# Patient Record
Sex: Female | Born: 1960 | Race: White | Hispanic: No | Marital: Married | Smoking: Current every day smoker
Health system: Southern US, Community
[De-identification: ages and names within clinical notes are randomized; demographics above are authoritative.]

## PROBLEM LIST (undated history)

## (undated) DIAGNOSIS — C50919 Malignant neoplasm of unspecified site of unspecified female breast: Principal | ICD-10-CM

## (undated) DIAGNOSIS — IMO0001 Reserved for inherently not codable concepts without codable children: Secondary | ICD-10-CM

## (undated) DIAGNOSIS — F32A Depression, unspecified: Secondary | ICD-10-CM

## (undated) DIAGNOSIS — Z9989 Dependence on other enabling machines and devices: Secondary | ICD-10-CM

## (undated) DIAGNOSIS — I10 Essential (primary) hypertension: Secondary | ICD-10-CM

## (undated) DIAGNOSIS — F329 Major depressive disorder, single episode, unspecified: Secondary | ICD-10-CM

## (undated) DIAGNOSIS — D649 Anemia, unspecified: Secondary | ICD-10-CM

## (undated) DIAGNOSIS — K635 Polyp of colon: Secondary | ICD-10-CM

## (undated) DIAGNOSIS — K219 Gastro-esophageal reflux disease without esophagitis: Secondary | ICD-10-CM

## (undated) DIAGNOSIS — G43909 Migraine, unspecified, not intractable, without status migrainosus: Secondary | ICD-10-CM

## (undated) DIAGNOSIS — E785 Hyperlipidemia, unspecified: Secondary | ICD-10-CM

## (undated) DIAGNOSIS — E119 Type 2 diabetes mellitus without complications: Secondary | ICD-10-CM

## (undated) DIAGNOSIS — G4733 Obstructive sleep apnea (adult) (pediatric): Secondary | ICD-10-CM

## (undated) HISTORY — DX: Essential (primary) hypertension: I10

## (undated) HISTORY — DX: Gastro-esophageal reflux disease without esophagitis: K21.9

## (undated) HISTORY — DX: Hyperlipidemia, unspecified: E78.5

## (undated) HISTORY — DX: Reserved for inherently not codable concepts without codable children: IMO0001

## (undated) HISTORY — DX: Depression, unspecified: F32.A

## (undated) HISTORY — DX: Malignant neoplasm of unspecified site of unspecified female breast: C50.919

## (undated) HISTORY — DX: Major depressive disorder, single episode, unspecified: F32.9

## (undated) HISTORY — DX: Polyp of colon: K63.5

## (undated) HISTORY — DX: Migraine, unspecified, not intractable, without status migrainosus: G43.909

## (undated) HISTORY — DX: Type 2 diabetes mellitus without complications: E11.9

## (undated) HISTORY — DX: Dependence on other enabling machines and devices: Z99.89

## (undated) HISTORY — DX: Anemia, unspecified: D64.9

## (undated) HISTORY — DX: Obstructive sleep apnea (adult) (pediatric): G47.33

## (undated) HISTORY — PX: CHOLECYSTECTOMY: SHX55

---

## 2004-12-03 ENCOUNTER — Ambulatory Visit: Payer: Self-pay | Admitting: Internal Medicine

## 2005-12-20 ENCOUNTER — Ambulatory Visit: Payer: Self-pay | Admitting: Internal Medicine

## 2006-12-25 ENCOUNTER — Ambulatory Visit: Payer: Self-pay | Admitting: Internal Medicine

## 2006-12-25 LAB — CONVERTED CEMR LAB
ALT: 13 units/L (ref 0–40)
Alkaline Phosphatase: 45 units/L (ref 39–117)
BUN: 9 mg/dL (ref 6–23)
Basophils Absolute: 0.1 10*3/uL (ref 0.0–0.1)
Basophils Relative: 1.1 % — ABNORMAL HIGH (ref 0.0–1.0)
CO2: 26 meq/L (ref 19–32)
Calcium: 8.4 mg/dL (ref 8.4–10.5)
Chloride: 110 meq/L (ref 96–112)
Eosinophils Relative: 2.7 % (ref 0.0–5.0)
GFR calc Af Amer: 101 mL/min
GFR calc non Af Amer: 83 mL/min
HCT: 31.2 % — ABNORMAL LOW (ref 36.0–46.0)
MCHC: 32.7 g/dL (ref 30.0–36.0)
Neutrophils Relative %: 54.1 % (ref 43.0–77.0)
RBC: 4.53 M/uL (ref 3.87–5.11)
RDW: 16.2 % — ABNORMAL HIGH (ref 11.5–14.6)
Total CHOL/HDL Ratio: 6.6
Total Protein: 6.2 g/dL (ref 6.0–8.3)
Triglycerides: 206 mg/dL (ref 0–149)
VLDL: 41 mg/dL — ABNORMAL HIGH (ref 0–40)
WBC: 7.7 10*3/uL (ref 4.5–10.5)

## 2007-01-07 ENCOUNTER — Ambulatory Visit: Payer: Self-pay | Admitting: Internal Medicine

## 2007-01-19 ENCOUNTER — Encounter: Admission: RE | Admit: 2007-01-19 | Discharge: 2007-01-19 | Payer: Self-pay | Admitting: Internal Medicine

## 2007-02-04 ENCOUNTER — Encounter: Payer: Self-pay | Admitting: Internal Medicine

## 2007-03-13 ENCOUNTER — Encounter: Payer: Self-pay | Admitting: Internal Medicine

## 2007-03-17 ENCOUNTER — Other Ambulatory Visit: Admission: RE | Admit: 2007-03-17 | Discharge: 2007-03-17 | Payer: Self-pay | Admitting: Internal Medicine

## 2007-03-17 ENCOUNTER — Encounter (INDEPENDENT_AMBULATORY_CARE_PROVIDER_SITE_OTHER): Payer: Self-pay | Admitting: *Deleted

## 2007-03-17 ENCOUNTER — Ambulatory Visit: Payer: Self-pay | Admitting: Internal Medicine

## 2007-03-30 ENCOUNTER — Encounter: Payer: Self-pay | Admitting: Internal Medicine

## 2007-04-07 ENCOUNTER — Encounter: Payer: Self-pay | Admitting: Internal Medicine

## 2007-04-09 DIAGNOSIS — G43909 Migraine, unspecified, not intractable, without status migrainosus: Secondary | ICD-10-CM | POA: Insufficient documentation

## 2007-04-09 DIAGNOSIS — I10 Essential (primary) hypertension: Secondary | ICD-10-CM | POA: Insufficient documentation

## 2007-04-09 DIAGNOSIS — Z8711 Personal history of peptic ulcer disease: Secondary | ICD-10-CM

## 2007-06-01 ENCOUNTER — Ambulatory Visit: Payer: Self-pay | Admitting: Internal Medicine

## 2007-06-01 DIAGNOSIS — E785 Hyperlipidemia, unspecified: Secondary | ICD-10-CM | POA: Insufficient documentation

## 2007-06-01 DIAGNOSIS — D649 Anemia, unspecified: Secondary | ICD-10-CM

## 2007-06-01 DIAGNOSIS — D126 Benign neoplasm of colon, unspecified: Secondary | ICD-10-CM

## 2007-06-02 LAB — CONVERTED CEMR LAB
HDL: 33.7 mg/dL — ABNORMAL LOW (ref >39.0)
Hemoglobin: 13.5 g/dL (ref 12.0–15.0)
Triglycerides: 277 mg/dL (ref 0–149)
VLDL: 55 mg/dL — ABNORMAL HIGH (ref 0–40)

## 2007-06-03 ENCOUNTER — Encounter (INDEPENDENT_AMBULATORY_CARE_PROVIDER_SITE_OTHER): Payer: Self-pay | Admitting: *Deleted

## 2007-06-05 ENCOUNTER — Telehealth (INDEPENDENT_AMBULATORY_CARE_PROVIDER_SITE_OTHER): Payer: Self-pay | Admitting: *Deleted

## 2007-07-21 ENCOUNTER — Encounter: Payer: Self-pay | Admitting: Internal Medicine

## 2007-12-02 ENCOUNTER — Ambulatory Visit: Payer: Self-pay | Admitting: Internal Medicine

## 2007-12-02 DIAGNOSIS — F329 Major depressive disorder, single episode, unspecified: Secondary | ICD-10-CM

## 2007-12-02 DIAGNOSIS — F17200 Nicotine dependence, unspecified, uncomplicated: Secondary | ICD-10-CM | POA: Insufficient documentation

## 2007-12-06 ENCOUNTER — Encounter: Payer: Self-pay | Admitting: Internal Medicine

## 2008-01-04 ENCOUNTER — Ambulatory Visit: Payer: Self-pay | Admitting: Internal Medicine

## 2008-01-04 DIAGNOSIS — E669 Obesity, unspecified: Secondary | ICD-10-CM | POA: Insufficient documentation

## 2008-01-12 ENCOUNTER — Ambulatory Visit: Payer: Self-pay | Admitting: Internal Medicine

## 2008-10-04 ENCOUNTER — Ambulatory Visit: Payer: Self-pay | Admitting: Internal Medicine

## 2009-01-16 HISTORY — PX: NOVASURE ABLATION: SHX5394

## 2009-02-24 ENCOUNTER — Ambulatory Visit: Payer: Self-pay | Admitting: Internal Medicine

## 2009-11-06 ENCOUNTER — Ambulatory Visit: Payer: Self-pay | Admitting: Internal Medicine

## 2009-11-09 LAB — CONVERTED CEMR LAB
Basophils Absolute: 0.2 10*3/uL — ABNORMAL HIGH (ref 0.0–0.1)
CO2: 26 meq/L (ref 19–32)
Direct LDL: 183.4 mg/dL
Eosinophils Absolute: 3.7 10*3/uL — ABNORMAL HIGH (ref 0.0–0.7)
Glucose, Bld: 82 mg/dL (ref 70–99)
HCT: 45.6 % (ref 36.0–46.0)
Hemoglobin: 14.8 g/dL (ref 12.0–15.0)
Iron: 149 ug/dL — ABNORMAL HIGH (ref 42–145)
Lymphs Abs: 2.9 10*3/uL (ref 0.7–4.0)
MCHC: 32.4 g/dL (ref 30.0–36.0)
MCV: 86.2 fL (ref 78.0–100.0)
Neutro Abs: 1.9 10*3/uL (ref 1.4–7.7)
Potassium: 4.5 meq/L (ref 3.5–5.1)
RDW: 14.2 % (ref 11.5–14.6)
Sodium: 144 meq/L (ref 135–145)
Transferrin: 359.1 mg/dL (ref 212.0–360.0)
VLDL: 50.2 mg/dL — ABNORMAL HIGH (ref 0.0–40.0)

## 2010-01-12 ENCOUNTER — Ambulatory Visit: Payer: Self-pay | Admitting: Internal Medicine

## 2010-01-22 ENCOUNTER — Telehealth: Payer: Self-pay | Admitting: Internal Medicine

## 2010-02-07 ENCOUNTER — Telehealth (INDEPENDENT_AMBULATORY_CARE_PROVIDER_SITE_OTHER): Payer: Self-pay | Admitting: *Deleted

## 2010-08-09 ENCOUNTER — Telehealth (INDEPENDENT_AMBULATORY_CARE_PROVIDER_SITE_OTHER): Payer: Self-pay | Admitting: *Deleted

## 2010-08-22 ENCOUNTER — Ambulatory Visit: Payer: Self-pay | Admitting: Internal Medicine

## 2010-08-24 ENCOUNTER — Ambulatory Visit: Payer: Self-pay | Admitting: Internal Medicine

## 2010-08-28 LAB — CONVERTED CEMR LAB
AST: 22 units/L (ref 0–37)
Cholesterol: 175 mg/dL (ref 0–200)
LDL Cholesterol: 101 mg/dL — ABNORMAL HIGH (ref 0–99)

## 2010-10-10 ENCOUNTER — Encounter: Payer: Self-pay | Admitting: Internal Medicine

## 2010-10-19 ENCOUNTER — Ambulatory Visit: Payer: Self-pay | Admitting: Internal Medicine

## 2010-10-19 ENCOUNTER — Encounter: Payer: Self-pay | Admitting: Internal Medicine

## 2010-12-09 ENCOUNTER — Encounter: Payer: Self-pay | Admitting: Internal Medicine

## 2010-12-18 NOTE — Assessment & Plan Note (Signed)
Summary: FOLLOWUP APPT--MED REFILL///SPH   Vital Signs:  Patient profile:   50 year old female Height:      67 inches Weight:      234.50 pounds BMI:     36.86 Pulse rate:   90 / minute Pulse rhythm:   regular BP sitting:   126 / 84  (left arm) Cuff size:   large  Vitals Entered By: Army Fossa CMA (August 22, 2010 3:06 PM) CC: Follow up on meds, not fasting  Comments declines flu shot    History of Present Illness: ROV started choleterol meds   ROS c/o hot flashes, severe  no myalgias  except in the L arm  when she takes Ca symptoms started before she initiated statins) diet-- no better  exercise-- non existen on further questioning, she has been depressed, states that she has a very poor relationship with her husband mostly because "his nasty attitude about my smoking, he is not supportive, I don't feel loved"  in the past she had some suicidal ideas but no plans  Allergies: No Known Drug Allergies  Past History:  Past Medical History: Hypertension dyslipidemia colon polyps anemia-- EGD and C  scope 2008; Novasure 2010 for excessive vag bleed  migraines HTN Depression  Past Surgical History: Reviewed history from 11/06/2009 and no changes required. Cholecystectomy NovaSure 01/2009  Social History: Reviewed history from 11/06/2009 and no changes required. Married one daughter birth control, husband had a vasectomy Current Smoker (2 PPD) Alcohol use-no Drug use-no Regular exercise-no  Physical Exam  General:  alert and well-developed.   Psych:  Oriented X3, memory intact for recent and remote, normally interactive, and good eye contact. no anxious but  tearful    Impression & Recommendations:  Problem # 1:  DYSLIPIDEMIA (ICD-272.4) started simvastatin recently, good tolerance, labs Her updated medication list for this problem includes:    Simvastatin 40 Mg Tabs (Simvastatin) .Marland Kitchen... 1 by mouth at bedtime  Labs Reviewed: SGOT: 17  (11/06/2009)   SGPT: 12 (11/06/2009)   HDL:32.80 (11/06/2009), 33.7 (06/01/2007)  LDL:DEL (06/01/2007), DEL (12/25/2006)  Chol:241 (11/06/2009), 242 (06/01/2007)  Trig:251.0 (11/06/2009), 277 (06/01/2007)  Problem # 2:  DEPRESSION (ICD-311) we had an extensive discussion about how she feels. In the past she saw  a Librarian, academic which helped. I counseled  her today the best I could. She declined   a referral to a counselor or to start any medication.  States that she will think about it and let me know. Again no suicidal ideas.  Problem # 3:  other issues  also complained of hot  flashes and chronic, on-off  left breast pain (no chest pain) She will see her gynecologist next month, recommend to discuss these issues with them  Problem # 4:  face-to-face more than 25 minutes, more than 50% of the time spent counseling  Complete Medication List: 1)  Prilosec 20 Mg Cpdr (Omeprazole) .... Take 1 capsule by mouth once a day 2)  Atenolol 25 Mg Tabs (Atenolol) .Marland Kitchen.. 1 by mouth qd 3)  Simvastatin 40 Mg Tabs (Simvastatin) .Marland Kitchen.. 1 by mouth at bedtime  Patient Instructions: 1)  came back fasting within a week: 2)  FLP AST ALT---dx high chol. 3)  came back by 12-11 for your physical Prescriptions: SIMVASTATIN 40 MG TABS (SIMVASTATIN) 1 by mouth at bedtime  #90 x 1   Entered by:   Army Fossa CMA   Authorized by:   Nolon Rod. Cheryl Chay MD   Signed by:  Girtrude Enslin E. Nohelani Benning MD on 08/22/2010   Method used:   Faxed to ...       [facility] (mail-order)       [address]       [place], [place]  [address]       Ph: [phone]       Fax: [phone]   RxID:   [id number] ATENOLOL 25 MG TABS (ATENOLOL) 1 by mouth qd  #90 x 1   Entered by:   Army Fossa CMA   Authorized by:   Nolon Rod. Nevaeh Korte MD   Signed by:   Nolon Rod. Journey Ratterman MD on 08/22/2010   Method used:   Faxed to ...       [address] [facility] (mail-order)       [address]       [place], [place]  [address]       Ph: [phone]       Fax: [phone]   RxID:    [id number]

## 2010-12-18 NOTE — Progress Notes (Signed)
Summary: FYI ED  Phone Note Call from Patient   Caller: Patient Summary of Call: Pt c/o chest pain a heaviness  left breast area and under left arm. Recommend pt to [facility] for full assessment,  pt  agreed .Kandice Hams  January 22, 2010 12:27 PM  Initial call taken by: Kandice Hams,  January 22, 2010 12:27 PM  Follow-up for Phone Call        noted Follow-up by: [facility] E. Mikiala Fugett MD,  January 22, 2010 12:50 PM

## 2010-12-18 NOTE — Progress Notes (Signed)
Summary: refill  Phone Note Refill Request Message from:  Fax from Pharmacy on February 07, 2010 2:49 PM  Refills Requested: Medication #1:  ATENOLOL 25 MG TABS 1 by mouth qd  Medication #2:  SIMVASTATIN 40 MG TABS 1 by mouth at bedtime [facility] fax [phone]   Method Requested: Mail to Patient Next Appointment Scheduled: no appt Initial call taken by: Barb Merino,  February 07, 2010 2:50 PM    Prescriptions: SIMVASTATIN 40 MG TABS (SIMVASTATIN) 1 by mouth at bedtime  #90 x 1   Entered by:   Shary Decamp   Authorized by:   Nolon Rod. Paz MD   Signed by:   Shary Decamp on 02/07/2010   Method used:   Faxed to ...       [facility] (mail-order)       [address]       [place], [place]  [address]       Ph: [phone]       Fax: [phone]   RxID:   [id number] ATENOLOL 25 MG TABS (ATENOLOL) 1 by mouth qd  #90 x 1   Entered by:   Shary Decamp   Authorized by:   Nolon Rod. Paz MD   Signed by:   Shary Decamp on 02/07/2010   Method used:   Faxed to ...       [address] [facility] (mail-order)       [address]       [place], [place]  [address]       Ph: [phone]       Fax: [phone]   RxID:   [id number]

## 2010-12-18 NOTE — Progress Notes (Signed)
Summary: refill  Phone Note Refill Request Message from:  Fax from Pharmacy on August 09, 2010 8:57 AM  Refills Requested: Medication #1:  ATENOLOL 25 MG TABS 1 by mouth qd [facility] - fax [phone]  Initial call taken by: Okey Regal Spring,  August 09, 2010 9:06 AM    Prescriptions: ATENOLOL 25 MG TABS (ATENOLOL) 1 by mouth qd  #90 x 0   Entered by:   Army Fossa CMA   Authorized by:   Nolon Rod. Paz MD   Signed by:   Army Fossa CMA on 08/09/2010   Method used:   Faxed to ...       [address] [facility] (mail-order)       [address]       [place], [place]  [address]       Ph: [phone]       Fax: [phone]   RxID:   [id number]

## 2010-12-18 NOTE — Assessment & Plan Note (Signed)
Summary: neck pain---alr   Vital Signs:  Patient profile:   51 year old female Weight:      235 pounds Pulse rate:   76 / minute BP sitting:   124 / 80  (right arm)  Vitals Entered By: Doristine Devoid (January 12, 2010 3:25 PM) CC: 2 month roa- HA x4 days starts in neck and goes up back of head    History of Present Illness: 4 day history of pain the pain developed suddenly 4 days ago, located in the posterior neck, radiates  from there to the back of the head,  on and off, no radiation to UE not worse with neck movements, decreased with a neck massage she has a history of migraines,, current pain  is different from her migraines   Allergies: No Known Drug Allergies  Past History:  Past Medical History: Reviewed history from 11/06/2009 and no changes required. Hypertension dyslipidemia colon polyps anemia-- EGD and C  scope 2008; Novasure 2010 for excessive vag bleed  migraines HTN Depression  Past Surgical History: Reviewed history from 11/06/2009 and no changes required. Cholecystectomy NovaSure 01/2009  Social History: Reviewed history from 11/06/2009 and no changes required. Married one daughter birth control, husband had a vasectomy Current Smoker (2 PPD) Alcohol use-no Drug use-no Regular exercise-no  Review of Systems       denies nausea or vomiting not taken any medication for these no fever or sinus congestion she did have a crown put on few days ago by her dentist  Physical Exam  General:  alert, well-developed, and overweight-appearing. no apparent distress  Neck:  full range of motion Lungs:  normal respiratory effort, no intercostal retractions, no accessory muscle use, and normal breath sounds.   Heart:  normal rate, regular rhythm, no murmur, and no gallop.   Neurologic:  alert & oriented X3 and cranial nerves II-XII intact.  speech normal reflexes symmetric motor   intact   Impression & Recommendations:  Problem # 1:  NECK PAIN  (ICD-723.1)  4-day history of pain, located more in the neck than in the head Plan: Conservative treatment with Flexeril and Tylenol if she is not better in a few days, will proceed with scan   patient will call me if she is not improving or if she gets worse   Her updated medication list for this problem includes:    Flexeril 10 Mg Tabs (Cyclobenzaprine hcl) ..... One by mouth twice a day as needed for pain  Complete Medication List: 1)  Prilosec 20 Mg Cpdr (Omeprazole) .... Take 1 capsule by mouth once a day 2)  Atenolol 25 Mg Tabs (Atenolol) .Marland Kitchen.. 1 by mouth qd 3)  Simvastatin 40 Mg Tabs (Simvastatin) .Marland Kitchen.. 1 by mouth at bedtime 4)  Flexeril 10 Mg Tabs (Cyclobenzaprine hcl) .... One by mouth twice a day as needed for pain  Patient Instructions: 1)  warm compress to the neck 2)  Flexeril twice a day as needed, will cause drowsiness 3)  Take 1000 mg of tylenol every 6 hours as needed for relief of pain. Avoid taking more than 4000 mg in a 24 hour period( can cause liver damage in higher doses).  4)  call if not better in a few days 5)  call anytime if the pain gets worse Prescriptions: FLEXERIL 10 MG TABS (CYCLOBENZAPRINE HCL) one by mouth twice a day as needed for pain  #20 x 0   Entered and Authorized by:   Nolon Rod. Petrita Blunck MD   Signed  by:   Nolon Rod Najmah Carradine MD on 01/12/2010   Method used:   Print then Give to Patient   RxID:   [id number]

## 2010-12-18 NOTE — Letter (Signed)
Summary: [facility]  [facility]   Imported By: Lanelle Bal 10/25/2010 14:53:30  _____________________________________________________________________  External Attachment:    Type:   Image     Comment:   External Document

## 2010-12-18 NOTE — Progress Notes (Signed)
Summary: refill/appt--CALLED, MADE APPT  Phone Note Refill Request Message from:  Fax from Pharmacy on August 09, 2010 10:58 AM  Refills Requested: Medication #1:  SIMVASTATIN 40 MG TABS 1 by mouth at bedtime [facility] - fax [phone]  Initial call taken by: Okey Regal Spring,  August 09, 2010 11:00 AM  Follow-up for Phone Call        refilled meds once ,needs OV before addtional refills. Army Fossa CMA  August 09, 2010 11:12 AM   Additional Follow-up for Phone Call Additional follow up Details #1::        MADE FOLLOWUP APPT FOR 10/5 Additional Follow-up by: Jerolyn Shin,  August 13, 2010 10:30 AM    Prescriptions: SIMVASTATIN 40 MG TABS (SIMVASTATIN) 1 by mouth at bedtime  #90 x 0   Entered by:   Army Fossa CMA   Authorized by:   Nolon Rod. Paz MD   Signed by:   Army Fossa CMA on 08/09/2010   Method used:   Faxed to ...       [address] [facility] (mail-order)       [address]       [place], [place]  [address]       Ph: [phone]       Fax: [phone]   RxID:   [id number]

## 2010-12-18 NOTE — Assessment & Plan Note (Signed)
Summary: CPX///SPH   Vital Signs:  Patient profile:   50 year old female Height:      67 inches Weight:      240 pounds Pulse rate:   79 / minute Pulse rhythm:   regular BP sitting:   152 / 92  (left arm) Cuff size:   large  Vitals Entered By: Army Fossa CMA (October 19, 2010 2:58 PM) CC: CPX, not fasting Comments BP High @ gyn. declines flu shot not gotten results back from PAP [facility]   History of Present Illness: CPX, not fasting  BP noted to be  elevated at gyn office     Current Medications (verified): 1)  Prilosec 20 Mg Cpdr (Omeprazole) .... Take 1 Capsule By Mouth Once A Day 2)  Atenolol 25 Mg Tabs (Atenolol) .Marland Kitchen.. 1 By Mouth Qd 3)  Simvastatin 40 Mg Tabs (Simvastatin) .Marland Kitchen.. 1 By Mouth At Bedtime  Allergies (verified): No Known Drug Allergies  Past History:  Past Medical History: Reviewed history from 08/22/2010 and no changes required. Hypertension dyslipidemia colon polyps anemia-- EGD and C  scope 2008; Novasure 2010 for excessive vag bleed  migraines HTN Depression  Past Surgical History: Reviewed history from 11/06/2009 and no changes required. Cholecystectomy NovaSure 01/2009  Family History: CAD - F, several MIs, CABG HTN - F DM - F, sis stroke - GM colon Ca - PGF, age og dx? breast Ca -  G Aunt parkinson-- F  Social History: Married one daughter birth control, husband had a vasectomy Current Smoker (2 PPD) Alcohol use-no Drug use-no Regular exercise--no works at an [employer]   Review of Systems General:  Denies weight loss. CV:  Denies chest pain or discomfort and swelling of feet. Resp:  Denies cough and wheezing. GI:  Denies bloody stools, diarrhea, nausea, and vomiting.  Physical Exam  General:  alert, well-developed, and overweight-appearing.   Neck:  no masses.   Lungs:  normal respiratory effort, no intercostal retractions, no accessory muscle use, and normal breath sounds.   Heart:  normal  rate, regular rhythm, no murmur, and no gallop.   Abdomen:  soft, non-tender, no distention, no masses, no guarding, and no rigidity.   Extremities:  no pretibial edema bilaterally  Psych:  Oriented X3, memory intact for recent and remote, normally interactive, and good eye contact. no anxious , no depress    Impression & Recommendations:  Problem # 1:  HEALTH SCREENING (ICD-V70.0) Td 2008 declined flu shot, explained benefits  sees gyn, papa results pending  had a EGD - Cscope in the past d/t iron def ( 2008 )  Had one colon polyp, next Cscope per Dr Dan Humphreys in [place]  tobacco-- we did not discussed that today, see last OV  extensive conversation about her weight, very  aware she needs to loose wt d/t risk of obesity.  States lack of motivation is her main problem; I tried to motivate her but I don't think I was very succesful; does not like to see a counselor W W? she dissmised the idea   EKG at baseline compared to previous   Orders: Venipuncture (45409) TLB-BMP (Basic Metabolic Panel-BMET) (80048-METABOL) TLB-CBC Platelet - w/Differential (85025-CBCD) EKG w/ Interpretation (93000)  Problem # 2:  DYSLIPIDEMIA (ICD-272.4) better  Her updated medication list for this problem includes:    Simvastatin 40 Mg Tabs (Simvastatin) .Marland Kitchen... 1 by mouth at bedtime  Labs Reviewed: SGOT: 22 (08/24/2010)   SGPT: 17 (08/24/2010)   HDL:34.60 (08/24/2010), 32.80 (11/06/2009)  LDL:101 (08/24/2010), DEL (06/01/2007)  Chol:175 (08/24/2010), 241 (11/06/2009)  Trig:198.0 (08/24/2010), 251.0 (11/06/2009)  Problem # 3:  HYPERTENSION (ICD-401.9) not well controlled lately , increase atenolol. see instructions   Her updated medication list for this problem includes:    Atenolol 50 Mg Tabs (Atenolol) .Marland Kitchen... 1 by mouth once daily  BP today: 152/92 Prior BP: 126/84 (08/22/2010)  Labs Reviewed: K+: 4.5 (11/06/2009) Creat: : 0.8 (11/06/2009)   Chol: 175 (08/24/2010)   HDL: 34.60 (08/24/2010)   LDL: 101  (08/24/2010)   TG: 198.0 (08/24/2010)  Complete Medication List: 1)  Prilosec 20 Mg Cpdr (Omeprazole) .... Take 1 capsule by mouth once a day 2)  Atenolol 50 Mg Tabs (Atenolol) .Marland Kitchen.. 1 by mouth once daily 3)  Simvastatin 40 Mg Tabs (Simvastatin) .Marland Kitchen.. 1 by mouth at bedtime  Patient Instructions: 1)  increase atenolol  to 50mg  ----> Check your blood pressure 2 or 3 times a week. If it is more than 140/85 consistently,please let us know  2)  Please schedule a follow-up appointment in 6 months .  Prescriptions: ATENOLOL 50 MG TABS (ATENOLOL) 1 by mouth once daily  #90 x 1   Entered and Authorized by:   Elita Quick E. Paz MD   Signed by:   Nolon Rod. Paz MD on 10/19/2010   Method used:   Print then Give to Patient   RxID:   [id number]    Orders Added: 1)  Venipuncture [14782] 2)  TLB-BMP (Basic Metabolic Panel-BMET) [80048-METABOL] 3)  TLB-CBC Platelet - w/Differential [85025-CBCD] 4)  EKG w/ Interpretation [93000] 5)  Est. Patient age 48-64 [3]     Preventive Care Screening  Mammogram:    Date:  03/18/2010    Results:  normal-per pt     Risk Factors:  Mammogram History:     Date of Last Mammogram:  03/18/2010    Results:  normal-per pt

## 2010-12-28 ENCOUNTER — Ambulatory Visit (INDEPENDENT_AMBULATORY_CARE_PROVIDER_SITE_OTHER): Payer: Managed Care, Other (non HMO) | Admitting: Internal Medicine

## 2010-12-28 ENCOUNTER — Other Ambulatory Visit: Payer: Self-pay | Admitting: Internal Medicine

## 2010-12-28 ENCOUNTER — Encounter: Payer: Self-pay | Admitting: Internal Medicine

## 2010-12-28 DIAGNOSIS — R599 Enlarged lymph nodes, unspecified: Secondary | ICD-10-CM | POA: Insufficient documentation

## 2010-12-28 DIAGNOSIS — Z0000 Encounter for general adult medical examination without abnormal findings: Secondary | ICD-10-CM

## 2010-12-28 LAB — CBC WITH DIFFERENTIAL/PLATELET
Basophils Relative: 0.4 % (ref 0.0–3.0)
Eosinophils Relative: 3 % (ref 0.0–5.0)
Hemoglobin: 14.8 g/dL (ref 12.0–15.0)
Lymphocytes Relative: 30.4 % (ref 12.0–46.0)
MCHC: 33.9 g/dL (ref 30.0–36.0)
Monocytes Relative: 7.6 % (ref 3.0–12.0)
Neutro Abs: 5.7 10*3/uL (ref 1.4–7.7)
RBC: 5.04 Mil/uL (ref 3.87–5.11)
WBC: 9.8 10*3/uL (ref 4.5–10.5)

## 2010-12-28 LAB — BASIC METABOLIC PANEL
CO2: 27 mEq/L (ref 19–32)
Calcium: 9.2 mg/dL (ref 8.4–10.5)
GFR: 73.08 mL/min (ref >60.00)
Sodium: 141 mEq/L (ref 135–145)

## 2011-01-03 NOTE — Assessment & Plan Note (Signed)
Summary: neck swelling///sph   Vital Signs:  Patient profile:   50 year old female Weight:      240.25 pounds Temp:     98.4 degrees F oral Pulse rate:   82 / minute Pulse rhythm:   regular BP sitting:   134 / 84  (left arm) Cuff size:   large  Vitals Entered By: Army Fossa CMA (December 28, 2010 8:33 AM) CC: Pt here c/o -Ha's - pain from (L) ear down neck Comments started Sunday [facility] [address]   History of Present Illness: the patient developed a flulike illness one week ago, she felt achy and chilly she is here today because she has  pain at the left neck, from the ear down. The area is a slightly puffy  ROS No sore throat or runny nose No fevers No clear discharge cough is at baseline  Current Medications (verified): 1)  Prilosec 20 Mg Cpdr (Omeprazole) .... Take 1 Capsule By Mouth Once A Day 2)  Atenolol 50 Mg Tabs (Atenolol) .Marland Kitchen.. 1 By Mouth Once Daily 3)  Simvastatin 40 Mg Tabs (Simvastatin) .Marland Kitchen.. 1 By Mouth At Bedtime  Allergies (verified): No Known Drug Allergies  Past History:  Past Medical History: Reviewed history from 08/22/2010 and no changes required. Hypertension dyslipidemia colon polyps anemia-- EGD and C  scope 2008; Novasure 2010 for excessive vag bleed  migraines HTN Depression  Past Surgical History: Reviewed history from 11/06/2009 and no changes required. Cholecystectomy NovaSure 01/2009  Social History: Reviewed history from 10/19/2010 and no changes required. Married one daughter birth control, husband had a vasectomy Current Smoker (2 PPD) Alcohol use-no Drug use-no Regular exercise--no works at an [employer]   Physical Exam  General:  alert, well-developed, and overweight-appearing.   Ears:  R ear normal and L ear normal.  percussion of the mastoid  areas negative Nose:  slightly congested Mouth:  no redness or discharge, no obvious gum absces on exam Neck:  she has some puffiness without clear-cut mass  of the left side of the neck; the   area is  slightly tender Lungs:  normal respiratory effort, no intercostal retractions, no accessory muscle use, and normal breath sounds.     Impression & Recommendations:  Problem # 1:  CERVICAL LYMPHADENOPATHY (ICD-785.6) Discomfort and puffiness of the left neck after an apparent viral infection. Most likely she has a lymphadenopathy. Recommend observation, encouraged self palpation in the next few days. If puffiness persists she needs to come back to the office for re-eval  Problem # 2:  HEALTH SCREENING (ICD-V70.0)  CBC and BMP ordered few weeks ago, not done. Re-ordered    Orders: Venipuncture (16109) TLB-BMP (Basic Metabolic Panel-BMET) (80048-METABOL) TLB-CBC Platelet - w/Differential (85025-CBCD) Specimen Handling (60454)  Complete Medication List: 1)  Prilosec 20 Mg Cpdr (Omeprazole) .... Take 1 capsule by mouth once a day 2)  Atenolol 50 Mg Tabs (Atenolol) .Marland Kitchen.. 1 by mouth once daily 3)  Simvastatin 40 Mg Tabs (Simvastatin) .Marland Kitchen.. 1 by mouth at bedtime  Patient Instructions: 1)  Tylenol for pain 2)  Please check the neck from  time to time in the next 2 weeks, if the puffiness is not gone  come back for a re-evaluation   Orders Added: 1)  Venipuncture [36415] 2)  TLB-BMP (Basic Metabolic Panel-BMET) [80048-METABOL] 3)  TLB-CBC Platelet - w/Differential [85025-CBCD] 4)  Specimen Handling [99000] 5)  Est. Patient Level III [[address]]

## 2011-01-11 ENCOUNTER — Telehealth: Payer: Self-pay | Admitting: Internal Medicine

## 2011-01-15 NOTE — Progress Notes (Signed)
Summary: pt status  Phone Note Call from Patient Call back at [phone]   Summary of Call: Patient is returning your call Initial call taken by: Freddy Jaksch,  January 11, 2011 11:15 AM  Follow-up for Phone Call        Called pt to check on pt's status of he neck- I spoke w/ pt she states that her neck is doing great!  Follow-up by: Army Fossa CMA,  January 11, 2011 11:17 AM

## 2011-05-19 HISTORY — PX: BREAST LUMPECTOMY: SHX2

## 2011-05-20 ENCOUNTER — Other Ambulatory Visit: Payer: Self-pay | Admitting: *Deleted

## 2011-05-20 ENCOUNTER — Other Ambulatory Visit: Payer: Self-pay | Admitting: Internal Medicine

## 2011-05-20 MED ORDER — SIMVASTATIN 40 MG PO TABS: 40.0000 mg | ORAL_TABLET | Freq: Every evening | ORAL | Status: DC

## 2011-05-20 MED ORDER — ATENOLOL 50 MG PO TABS: 50.0000 mg | ORAL_TABLET | Freq: Every day | ORAL | Status: DC

## 2011-05-21 ENCOUNTER — Ambulatory Visit (INDEPENDENT_AMBULATORY_CARE_PROVIDER_SITE_OTHER): Payer: Managed Care, Other (non HMO) | Admitting: Internal Medicine

## 2011-05-21 ENCOUNTER — Other Ambulatory Visit: Payer: Self-pay | Admitting: Internal Medicine

## 2011-05-21 ENCOUNTER — Encounter: Payer: Self-pay | Admitting: Internal Medicine

## 2011-05-21 DIAGNOSIS — C50919 Malignant neoplasm of unspecified site of unspecified female breast: Secondary | ICD-10-CM | POA: Insufficient documentation

## 2011-05-21 DIAGNOSIS — I10 Essential (primary) hypertension: Secondary | ICD-10-CM

## 2011-05-21 DIAGNOSIS — F329 Major depressive disorder, single episode, unspecified: Secondary | ICD-10-CM

## 2011-05-21 HISTORY — DX: Malignant neoplasm of unspecified site of unspecified female breast: C50.919

## 2011-05-21 MED ORDER — ATENOLOL 50 MG PO TABS: 50.0000 mg | ORAL_TABLET | Freq: Every day | ORAL | Status: DC

## 2011-05-21 MED ORDER — CARVEDILOL 25 MG PO TABS: 25.0000 mg | ORAL_TABLET | Freq: Two times a day (BID) | ORAL | Status: DC

## 2011-05-21 NOTE — Patient Instructions (Signed)
Change atenolol to Coreg Check the  blood pressure 2 or 3 times a week, be sure it is less than 140/85. If it is consistently higher, let me know Leg your anesthesiologist know that we're switching you to coreg

## 2011-05-21 NOTE — Assessment & Plan Note (Signed)
Diagnosis 03/2011, planning a lumpectomy and radiation therapy

## 2011-05-21 NOTE — Assessment & Plan Note (Addendum)
Needs better control, change atenolol 50 to  Coreg 12.5 twice a day

## 2011-05-21 NOTE — Telephone Encounter (Signed)
Patient had appt with dr Drue Novel today [phone] - bp med was changed but she is having sx on Friday [id number] anesthesiologist doesn't want her to change before sx incase she has a reaction - she wants 15 day supply of atenolol called [facility] to [facility] [address] - [place]

## 2011-05-21 NOTE — Telephone Encounter (Signed)
Okay, call at 2 week supply of atenolol, she was taking 50 mg one tablet a day, increase to 1.5 tablets daily

## 2011-05-21 NOTE — Assessment & Plan Note (Signed)
Fortunately she is doing okay, has a lot of family support. Still having some issues with her husband

## 2011-05-21 NOTE — Progress Notes (Signed)
  Subjective:    Patient ID: Wanda Ortega, female    DOB: Jan 10, 1961, 50 y.o.   MRN: [id number]  HPI Routine office visit Recently diagnosed with breast cancer, to have a lumpectomy at the end of this week, planning to start radiation therapy afterwards Ambulatory blood pressures have been elevated lately in the 150s /98 range.  Past Medical History  Diagnosis Date  . Hypertension   . Dyslipidemia   . Colon polyps   . Anemia     EGD and Cscope 2008; Novasure 2010 for excessive vag bleed  . Migraines   . Depression   . Breast cancer 05/21/2011    Past Surgical History  Procedure Date  . Cholecystectomy   . Novasure ablation 01/2009     Review of Systems Despite the recent diagnosis of breast cancer, she is emotionally doing okay. Has a lot of family support although she still has some issues with her husband.     Objective:   Physical Exam Alert, oriented x3, no apparent physical or emotional distress.       Assessment & Plan:  Today , I spent more than 15  min with the patient, >50% of the time counseling

## 2011-05-21 NOTE — Telephone Encounter (Signed)
Sent in med to pharm for 15 days.

## 2011-05-21 NOTE — Telephone Encounter (Signed)
Pt is aware.  

## 2011-08-20 ENCOUNTER — Ambulatory Visit: Payer: Managed Care, Other (non HMO) | Admitting: Internal Medicine

## 2011-08-20 ENCOUNTER — Ambulatory Visit
Admission: RE | Admit: 2011-08-20 | Discharge: 2011-08-20 | Disposition: A | Discharge status: Home / Self Care | Payer: Managed Care, Other (non HMO) | Source: Ambulatory Visit | Attending: Internal Medicine | Admitting: Internal Medicine

## 2011-08-20 ENCOUNTER — Encounter: Payer: Self-pay | Admitting: Internal Medicine

## 2011-08-20 ENCOUNTER — Ambulatory Visit (INDEPENDENT_AMBULATORY_CARE_PROVIDER_SITE_OTHER): Payer: Managed Care, Other (non HMO) | Admitting: Internal Medicine

## 2011-08-20 VITALS — BP 152/90 | HR 94 | Temp 98.4°F | Resp 16 | Wt 252.2 lb

## 2011-08-20 DIAGNOSIS — C50919 Malignant neoplasm of unspecified site of unspecified female breast: Secondary | ICD-10-CM

## 2011-08-20 DIAGNOSIS — G43909 Migraine, unspecified, not intractable, without status migrainosus: Secondary | ICD-10-CM

## 2011-08-20 DIAGNOSIS — E785 Hyperlipidemia, unspecified: Secondary | ICD-10-CM

## 2011-08-20 DIAGNOSIS — I10 Essential (primary) hypertension: Secondary | ICD-10-CM

## 2011-08-20 MED ORDER — SIMVASTATIN 40 MG PO TABS: 40.0000 mg | ORAL_TABLET | Freq: Every evening | ORAL | Status: DC

## 2011-08-20 MED ORDER — FELODIPINE ER 5 MG PO TB24: 5.0000 mg | ORAL_TABLET | Freq: Every day | ORAL | Status: DC

## 2011-08-20 MED ORDER — HYDROCODONE-ACETAMINOPHEN 5-500 MG PO TABS: 1.0000 | ORAL_TABLET | Freq: Four times a day (QID) | ORAL | Status: DC | PRN

## 2011-08-20 NOTE — Assessment & Plan Note (Addendum)
Not well controlled on atenolol, switched to coreg 2 days ago and still not well controlled : add plendil, watch for edema . Consider go back to atenolol (see migraines)

## 2011-08-20 NOTE — Patient Instructions (Signed)
Add plendil, call if BP not below 140/85 within few days, call if side effects For Headaches: take advil , watch for stomach side effects; you can also take vicodin If the headaches are severe or no better soon: let me know

## 2011-08-20 NOTE — Assessment & Plan Note (Addendum)
No complications from the lumpectomy, currently undergoing  radiation therapy 10 treatments out of  33

## 2011-08-20 NOTE — Progress Notes (Signed)
  Subjective:    Patient ID: Wanda Ortega, female    DOB: 1960/12/25, 50 y.o.   MRN: [id number]  HPI Acute office visit Breast cancer, status post lumpectomy, undergoing radiation therapy, no apparent complications. Hypertension--was not well controlled on atenolol, eventually run out of atenolol 3 days ago and switched to Coreg. Even with Coreg, her BP continued to be elevated around 150/80, 150/105. Headaches--developed a headache yesterday morning, global, quite intense, decreased with Advil. She reports that it was "the worst of her life"; had a headache again this morning but was not as severe. It does resemble her migraine headaches.  Past Medical History  Diagnosis Date  . Hypertension   . Dyslipidemia   . Colon polyps   . Anemia     EGD and Cscope 2008; Novasure 2010 for excessive vag bleed  . Migraines   . Depression   . Breast cancer 05/21/2011   Past Surgical History  Procedure Date  . Cholecystectomy   . Novasure ablation 01/2009     Review of Systems denies any fever or chills Mild sinus congestion No nausea, vomiting, diarrhea She also is having blurred vision which is a usual feature of her regular migraines.     Objective:   Physical Exam  Constitutional: She is oriented to person, place, and time. She appears well-developed and well-nourished. No distress.  HENT:  Head: Normocephalic and atraumatic.  Eyes: Pupils are equal, round, and reactive to light.  Cardiovascular: Normal rate, regular rhythm and normal heart sounds.   No murmur heard. Pulmonary/Chest: Effort normal and breath sounds normal. No respiratory distress. She has no wheezes. She has no rales.  Neurological: She is alert and oriented to person, place, and time. No cranial nerve deficit. She exhibits normal muscle tone. Coordination normal.  Skin: She is not diaphoretic.  Psychiatric: She has a normal mood and affect. Her behavior is normal. Judgment normal.       Assessment & Plan:

## 2011-08-20 NOTE — Assessment & Plan Note (Signed)
RF meds  

## 2011-08-20 NOTE — Assessment & Plan Note (Addendum)
Having severe HAs x 2 days, yesterday's was worse of life; neuro exam normal. Is possible that changing from atenolol to coreg have triggered a migraine , discussed w/ pt but she does not like to go back to atenolol for now b/c has a 3 month supply od coreg. Plan: CT head  advil as needed watch for PUD sx vicodin prn If no better in few days, she will call and reconsider go back to atenolol -------------------------------- Addendum, CT negative, I spoke to the patient and notified her [facility]

## 2011-11-18 ENCOUNTER — Other Ambulatory Visit: Payer: Self-pay

## 2011-11-18 MED ORDER — CARVEDILOL 25 MG PO TABS: 25.0000 mg | ORAL_TABLET | Freq: Two times a day (BID) | ORAL | Status: DC

## 2011-11-18 NOTE — Telephone Encounter (Signed)
Faxed.   KP 

## 2011-11-25 ENCOUNTER — Encounter: Payer: Self-pay | Admitting: Internal Medicine

## 2011-11-25 ENCOUNTER — Ambulatory Visit (INDEPENDENT_AMBULATORY_CARE_PROVIDER_SITE_OTHER): Payer: Managed Care, Other (non HMO) | Admitting: Internal Medicine

## 2011-11-25 VITALS — BP 138/82 | HR 82 | Temp 98.7°F | Ht 67.0 in | Wt 251.0 lb

## 2011-11-25 DIAGNOSIS — I10 Essential (primary) hypertension: Secondary | ICD-10-CM

## 2011-11-25 DIAGNOSIS — E785 Hyperlipidemia, unspecified: Secondary | ICD-10-CM

## 2011-11-25 DIAGNOSIS — F17200 Nicotine dependence, unspecified, uncomplicated: Secondary | ICD-10-CM

## 2011-11-25 MED ORDER — FELODIPINE ER 5 MG PO TB24: 5.0000 mg | ORAL_TABLET | Freq: Every day | ORAL | Status: DC

## 2011-11-25 MED ORDER — CARVEDILOL 25 MG PO TABS: 25.0000 mg | ORAL_TABLET | Freq: Two times a day (BID) | ORAL | Status: DC

## 2011-11-25 MED ORDER — SIMVASTATIN 40 MG PO TABS: 40.0000 mg | ORAL_TABLET | Freq: Every evening | ORAL | Status: DC

## 2011-11-25 NOTE — Assessment & Plan Note (Addendum)
Labs , request a nutritionist referral. Will do.

## 2011-11-25 NOTE — Assessment & Plan Note (Signed)
Seems  well-controlled, labs 

## 2011-11-25 NOTE — Assessment & Plan Note (Signed)
Still smoking, went back up to 2 packs a day. Patient is counseled, I believe that helping her depression will help quit tobacco. List of counselors for depression provided.

## 2011-11-25 NOTE — Progress Notes (Signed)
  Subjective:    Patient ID: Wanda Ortega, female    DOB: Nov 19, 1960, 51 y.o.   MRN: [id number]  HPI Routine office visit. Since the last time I saw her, the headache completely stopped, CT head was negative.  Past Medical History: Breast cancer 04-2011 , L lumpectomy, s/p SXR Hypertension dyslipidemia colon polyps anemia-- EGD and C  scope 2008; Novasure 2010 for excessive vag bleed  migraines HTN Depression  Past Surgical History: Cholecystectomy NovaSure 01/2009 Lumpectomy (L) July 2012  Family History: CAD - F, several MIs, CABG HTN - F DM - F, sis stroke - GM colon Ca - PGF, age og dx? breast Ca -  G Aunt parkinson-- F  Social History: Married, one daughter birth control, husband had a vasectomy Current Smoker (2 PPD) Alcohol use-no Drug use-no Regular exercise--no works at an [employer]     Review of Systems Good medication compliance with BP meds, no recent ambulatory BPs Continue smoking, went back up to 2 packs a day. Admits to mild depression and ongoing marital issues: Does not feel supported by her husband.. Needs a nutritionist referral.      Objective:   Physical Exam  Constitutional: She is oriented to person, place, and time. She appears well-developed. No distress.       Overweight appearing  Cardiovascular: Normal rate, regular rhythm and normal heart sounds.   Pulmonary/Chest: Effort normal and breath sounds normal. No respiratory distress. She has no wheezes. She has no rales.  Musculoskeletal: She exhibits no edema.  Neurological: She is alert and oriented to person, place, and time.  Skin: She is not diaphoretic.  Psychiatric: She has a normal mood and affect. Her behavior is normal. Judgment and thought content normal.      Assessment & Plan:  Declined to have a flu shot

## 2011-12-08 ENCOUNTER — Telehealth: Payer: Self-pay | Admitting: Internal Medicine

## 2011-12-08 NOTE — Telephone Encounter (Signed)
KALYN: Patient was seen on 11/25/2011, labs not done. Please schedule labs

## 2011-12-09 NOTE — Telephone Encounter (Signed)
Pt will come 12/16/11 @ 8:30am for labs

## 2011-12-09 NOTE — Telephone Encounter (Signed)
THX!

## 2011-12-13 ENCOUNTER — Other Ambulatory Visit: Payer: Self-pay | Admitting: Internal Medicine

## 2011-12-13 DIAGNOSIS — I10 Essential (primary) hypertension: Secondary | ICD-10-CM

## 2011-12-13 DIAGNOSIS — F17200 Nicotine dependence, unspecified, uncomplicated: Secondary | ICD-10-CM

## 2011-12-13 DIAGNOSIS — E785 Hyperlipidemia, unspecified: Secondary | ICD-10-CM

## 2011-12-16 ENCOUNTER — Other Ambulatory Visit (INDEPENDENT_AMBULATORY_CARE_PROVIDER_SITE_OTHER): Payer: Managed Care, Other (non HMO)

## 2011-12-16 DIAGNOSIS — F17200 Nicotine dependence, unspecified, uncomplicated: Secondary | ICD-10-CM

## 2011-12-16 DIAGNOSIS — E785 Hyperlipidemia, unspecified: Secondary | ICD-10-CM

## 2011-12-16 DIAGNOSIS — I10 Essential (primary) hypertension: Secondary | ICD-10-CM

## 2011-12-16 LAB — LIPID PANEL
HDL: 37.7 mg/dL — ABNORMAL LOW (ref >39.00)
LDL Cholesterol: 93 mg/dL (ref 0–99)
Total CHOL/HDL Ratio: 4
Triglycerides: 188 mg/dL — ABNORMAL HIGH (ref 0.0–149.0)
VLDL: 37.6 mg/dL (ref 0.0–40.0)

## 2011-12-19 ENCOUNTER — Encounter: Payer: Self-pay | Admitting: *Deleted

## 2012-02-24 ENCOUNTER — Ambulatory Visit (INDEPENDENT_AMBULATORY_CARE_PROVIDER_SITE_OTHER): Payer: Managed Care, Other (non HMO) | Admitting: Internal Medicine

## 2012-02-24 DIAGNOSIS — R079 Chest pain, unspecified: Secondary | ICD-10-CM | POA: Insufficient documentation

## 2012-02-24 DIAGNOSIS — Z0000 Encounter for general adult medical examination without abnormal findings: Secondary | ICD-10-CM | POA: Insufficient documentation

## 2012-02-24 NOTE — Progress Notes (Signed)
  Subjective:    Patient ID: Wanda Ortega, female    DOB: 21-Jan-1961, 51 y.o.   MRN: [id number]  HPI Here for a CPX She also reports a single episode of chest pain last week while walking. Pain was anterior, described as tightness, no radiation, no associated nausea, diaphoresis, presyncope or palpitations. Symptoms lasted a few minutes. Resolved spontaneously  Past Medical History: Breast cancer 04-2011 , L lumpectomy, s/p XRT HTN Dyslipidemia depression colon polyps anemia-- EGD and C  scope 2008; Novasure 2010 for excessive vag bleed   migraines  Past Surgical History: Cholecystectomy NovaSure 01/2009 Lumpectomy (L) July 2012  Family History: CAD - F onset late 53s, several MIs, CABG HTN - F DM - F, sis stroke - GM colon Ca - PGF, age og dx? breast Ca -  G Aunt, great uncle  parkinson-- F  Social History: Married, one daughter birth control, husband had a vasectomy Current Smoker--1 to 1.5 PPD Alcohol use-no Drug use-no Regular exercise--none Diet-- regular  works at an [employer]      Review of Systems  Constitutional: Negative for fever and fatigue.  Respiratory: Negative for cough (no hemoptysis) and shortness of breath.   Cardiovascular: Negative for palpitations and leg swelling.  Gastrointestinal: Negative for abdominal pain and blood in stool.       No GERD as long as she takes PPIs  Genitourinary: Negative for dysuria, hematuria and vaginal bleeding.       Objective:   Physical Exam General:  alert, well-developed, and overweight-appearing.   Neck:  no masses.  No thyromegaly Lungs:  normal respiratory effort, no intercostal retractions, no accessory muscle use, and normal breath sounds.  Chest wall tender to palpation at the left anterior aspect, pain elicited was different from the tightness  described above. Heart:  normal rate, regular rhythm, no murmur, and no gallop.   Abdomen:  soft, non-tender, no distention, no masses, no guarding,  and no rigidity.   Extremities:  no pretibial edema bilaterally  Psych:  Oriented X3, memory intact for recent and remote, normally interactive, and good eye contact. no anxious , no depress       Assessment & Plan:  Td 2008 sees gyn, Dr Wylene Simmer in [facility]  had a EGD - Cscope in the past d/t iron def ( 2008 ), had one colon polyp, Dr Dan Humphreys in [facility]; schedule to have another Cscope with the same  group soon  counseledabout tobacco, diet and exercise. Strongly recommend to start exercise programs once she is clear from day cardiovascular standpoint. See chest pain

## 2012-02-24 NOTE — Assessment & Plan Note (Signed)
51 year old female with multiple cardiovascular risk factors including family history, tobacco abuse, cholesterol and a sedentary lifestyle who presents with a single episode of chest pain while walking. EKG sinus rhythm , no acute findings Plan: Chest x-ray Stress test ER if symptoms severe

## 2012-02-24 NOTE — Patient Instructions (Signed)
Please get your x-ray at the other [place]  office located at: [address], across from [facility].  Please go to the basement, this is a walk-in facility, they are open from 8:30 to 5:30 PM. Phone number [phone].  

## 2012-02-25 ENCOUNTER — Ambulatory Visit (INDEPENDENT_AMBULATORY_CARE_PROVIDER_SITE_OTHER)
Admission: RE | Admit: 2012-02-25 | Discharge: 2012-02-25 | Disposition: A | Discharge status: Home / Self Care | Payer: Managed Care, Other (non HMO) | Source: Ambulatory Visit | Attending: Internal Medicine | Admitting: Internal Medicine

## 2012-02-25 ENCOUNTER — Other Ambulatory Visit: Payer: Managed Care, Other (non HMO)

## 2012-02-25 ENCOUNTER — Encounter: Payer: Self-pay | Admitting: Internal Medicine

## 2012-02-25 DIAGNOSIS — I10 Essential (primary) hypertension: Secondary | ICD-10-CM

## 2012-02-25 DIAGNOSIS — R079 Chest pain, unspecified: Secondary | ICD-10-CM

## 2012-02-25 DIAGNOSIS — E785 Hyperlipidemia, unspecified: Secondary | ICD-10-CM

## 2012-02-25 LAB — BASIC METABOLIC PANEL
CO2: 22 mEq/L (ref 19–32)
Calcium: 8.5 mg/dL (ref 8.4–10.5)
GFR: 66.58 mL/min (ref >60.00)
Sodium: 147 mEq/L — ABNORMAL HIGH (ref 135–145)

## 2012-02-25 LAB — CBC WITH DIFFERENTIAL/PLATELET
Basophils Relative: 0.6 % (ref 0.0–3.0)
Eosinophils Relative: 3.3 % (ref 0.0–5.0)
Hemoglobin: 13.5 g/dL (ref 12.0–15.0)
Lymphocytes Relative: 32.4 % (ref 12.0–46.0)
MCHC: 33.3 g/dL (ref 30.0–36.0)
Monocytes Relative: 8 % (ref 3.0–12.0)
Neutro Abs: 3.2 10*3/uL (ref 1.4–7.7)
RBC: 4.49 Mil/uL (ref 3.87–5.11)
WBC: 5.8 10*3/uL (ref 4.5–10.5)

## 2012-02-25 NOTE — Assessment & Plan Note (Signed)
Td 2008 sees gyn, Dr Wylene Simmer in [facility]  had a EGD - Cscope in the past d/t iron def ( 2008 ), had one colon polyp, Dr Dan Humphreys in [facility]; schedule to have another Cscope with the same  group soon  counseledabout tobacco, diet and exercise. Strongly recommend to start exercise programs once she is clear from the cardiovascular standpoint. See chest pain

## 2012-02-27 ENCOUNTER — Telehealth: Payer: Self-pay | Admitting: Internal Medicine

## 2012-02-27 NOTE — Telephone Encounter (Signed)
noted 

## 2012-02-27 NOTE — Telephone Encounter (Signed)
IN REFERENCE TO REST STRESS MYOVIEW, PER MY CALL [employer], CASE#[id number], CLINICAL REVIEW WAS UNABLE TO APPROVE, THEY HAVE SENT TO THEIR IN-HOUSE REVIEWER, WILL HAVE THEIR DECISION BY END OF DAY Monday, 03-02-12.  I HAVE MADE PATIENT AWARE.

## 2012-02-28 ENCOUNTER — Encounter: Payer: Self-pay | Admitting: *Deleted

## 2012-02-28 NOTE — Telephone Encounter (Signed)
Per phone call from patient's insurance, their medical director has Denied approval of Rest Stress Myoview, for case#[id number].  An explanation of their decision will be coming by fax.  If you wish to speak with insurance, call [phone], Option 4, then Option 2, reference above case number.

## 2012-02-28 NOTE — Telephone Encounter (Addendum)
I spoke with Dr. Sherilyn Cooter, a insurance representative, he said he is a Development worker, international aid, I explained to her she is a 51 year old female smoker, a sedentary lifestyle, high cholesterol, strong family history heart disease and in my opinion she needed a nuclear stress test, however in  his opinion a plain treadmill is enough to asses  her given her cardiovascular risk factor. Please inform the patient of her insurance decision. I enter order for a plain treadmill stress test (unless she is willing to pay out of pocket for the nuclear stress test) Additionally, her chest x-ray was normal.

## 2012-02-28 NOTE — Telephone Encounter (Signed)
Left msg to return call

## 2012-02-28 NOTE — Telephone Encounter (Signed)
Addended by: Willow Ora E on: 02/28/2012 04:52 PM   Modules accepted: Orders

## 2012-03-04 ENCOUNTER — Ambulatory Visit (INDEPENDENT_AMBULATORY_CARE_PROVIDER_SITE_OTHER): Payer: Managed Care, Other (non HMO) | Admitting: Physician Assistant

## 2012-03-04 DIAGNOSIS — R079 Chest pain, unspecified: Secondary | ICD-10-CM

## 2012-03-04 NOTE — Telephone Encounter (Signed)
LMOVM for pt to return call 

## 2012-03-04 NOTE — Progress Notes (Signed)
Wanda Ortega is a 51 y.o. female smoker with a hx of HTN and HLP referred by her PCP for ETT due to chest pain.  She has a strong FHx of CAD (dad with first MI in 1s).  No exertional chest pain. No dyspnea.  No syncope.  Exam unremarkable.   Exercise Treadmill Test  Pre-Exercise Testing Evaluation Rhythm: normal sinus  Rate: 75   PR:  .17 QRS:  .07  QT:  .38 QTc: .43     Test  Exercise Tolerance Test Ordering MD: Willow Ora , MD  Interpreting MD:  Tereso Newcomer PA-C  Unique Test No: 1  Treadmill:  1  Indication for ETT: chest pain - rule out ischemia  Contraindication to ETT: No   Stress Modality: exercise - treadmill  Cardiac Imaging Performed: non   Protocol: standard Bruce - maximal  Max BP:  174/85Max MPHR (bpm):  172 85% MPR (bpm):  146  MPHR obtained (bpm):  130 % MPHR obtained:  75%  Reached 85% MPHR (min:sec): NA Total Exercise Time (min-sec):  4:42  Workload in METS:  6.6 Borg Scale: 13  Reason ETT Terminated:  patient's desire to stop    ST Segment Analysis At Rest: non-specific ST segment slurring With Exercise: non-specific ST changes  Other Information Arrhythmia:  No Angina during ETT:  absent (0) Quality of ETT:  non-diagnostic  ETT Interpretation:  normal - no evidence of ischemia by ST analysis at sub-maximal exercise  Comments: Poor exercise tolerance. No chest pain. Normal BP response to exercise. No ST-T changes to suggest ischemia at submaximal exercise.   Recommendations: Arrange Lexiscan Myoview. Tereso Newcomer, PA-C  12:03 PM 03/04/2012

## 2012-03-12 ENCOUNTER — Other Ambulatory Visit (HOSPITAL_COMMUNITY): Payer: Managed Care, Other (non HMO)

## 2012-03-19 ENCOUNTER — Ambulatory Visit (HOSPITAL_COMMUNITY): Payer: Managed Care, Other (non HMO) | Attending: Cardiology | Admitting: Radiology

## 2012-03-19 DIAGNOSIS — I10 Essential (primary) hypertension: Secondary | ICD-10-CM | POA: Insufficient documentation

## 2012-03-19 DIAGNOSIS — F17200 Nicotine dependence, unspecified, uncomplicated: Secondary | ICD-10-CM | POA: Insufficient documentation

## 2012-03-19 DIAGNOSIS — R079 Chest pain, unspecified: Secondary | ICD-10-CM | POA: Insufficient documentation

## 2012-03-19 DIAGNOSIS — R0609 Other forms of dyspnea: Secondary | ICD-10-CM | POA: Insufficient documentation

## 2012-03-19 DIAGNOSIS — R000 Tachycardia, unspecified: Secondary | ICD-10-CM | POA: Insufficient documentation

## 2012-03-19 DIAGNOSIS — E785 Hyperlipidemia, unspecified: Secondary | ICD-10-CM | POA: Insufficient documentation

## 2012-03-19 DIAGNOSIS — R42 Dizziness and giddiness: Secondary | ICD-10-CM | POA: Insufficient documentation

## 2012-03-19 DIAGNOSIS — R0989 Other specified symptoms and signs involving the circulatory and respiratory systems: Secondary | ICD-10-CM | POA: Insufficient documentation

## 2012-03-19 DIAGNOSIS — R0602 Shortness of breath: Secondary | ICD-10-CM | POA: Insufficient documentation

## 2012-03-19 DIAGNOSIS — R002 Palpitations: Secondary | ICD-10-CM | POA: Insufficient documentation

## 2012-03-19 MED ORDER — REGADENOSON 0.4 MG/5ML IV SOLN
0.4000 mg | Freq: Once | INTRAVENOUS | Status: AC
  Administered 2012-03-19: 0.4 mg via INTRAVENOUS

## 2012-03-19 MED ORDER — TECHNETIUM TC 99M TETROFOSMIN IV KIT
33.0000 | PACK | Freq: Once | INTRAVENOUS | Status: AC | PRN
  Administered 2012-03-19: 33 via INTRAVENOUS

## 2012-03-19 NOTE — Progress Notes (Signed)
[facility] SITE 3 NUCLEAR MED [address] [place] [address] [phone]  Cardiology Nuclear Med Study  Wanda Ortega is a 51 y.o. female     MRN : [id number]     DOB: [birth year]  Procedure Date: 03/19/2012  Nuclear Med Background Indication for Stress Test:  Evaluation for Ischemia History:  4/13 GXT poor exercise tolerance Cardiac Risk Factors: Family History - CAD, Hypertension, Lipids and Smoker  Symptoms:  Chest Pain, DOE, Light-Headedness, Palpitations, Rapid HR and SOB   Nuclear Pre-Procedure Caffeine/Decaff Intake:  None NPO After: 630 pm   Lungs:  clear O2 Sat: 96% on room air. IV 0.9% NS with Angio Cath:  22g  IV Site: R Hand  IV Started by:  Bonnita Levan, RN  Chest Size (in):  42 Cup Size: DD  Height: 5\' 7"  (1.702 m)  Weight:  257 lb (116.574 kg)  BMI:  Body mass index is 40.25 kg/(m^2). Tech Comments:  Patient held all meds today    Nuclear Med Study 1 or 2 day study: 2 day  Stress Test Type:  Eugenie Birks  Reading MD: Lorre Nick. Order Authorizing Provider:  Lewayne Bunting, MD  Resting Radionuclide: Technetium 57m Tetrofosmin  Resting Radionuclide Dose: 33.0 mCi  On 03-25-12  Stress Radionuclide:  Technetium 83m Tetrofosmin  Stress Radionuclide Dose: 33.0 mCi  On  03-19-12           Stress Protocol Rest HR: 81 Stress HR: 97  Rest BP: 145/104 Stress BP: 164/77  Exercise Time (min): n/a METS: n/a   Predicted Max HR: 172 bpm % Max HR: 56.4 bpm Rate Pressure Product: 21308   Dose of Adenosine (mg):  n/a Dose of Lexiscan: 0.4 mg  Dose of Atropine (mg): n/a Dose of Dobutamine: n/a mcg/kg/min (at max HR)  Stress Test Technologist: Bonnita Levan, RN  Nuclear Technologist:  Domenic Polite, CNMT     Rest Procedure:  Myocardial perfusion imaging was performed at rest 45 minutes following the intravenous administration of Technetium 39m Tetrofosmin. Rest ECG: No acute changes  Stress Procedure:  The patient received IV Lexiscan 0.4 mg  over 15-seconds.  Technetium 3m Tetrofosmin injected at 30-seconds.  There were no significant changes with Lexiscan.  Quantitative spect images were obtained after a 45 minute delay. Stress ECG: No significant change from baseline ECG  QPS Raw Data Images:  Patient motion noted. Stress Images:  Likely breast artifact worse on stress images Rest Images:  Breast artifact Subtraction (SDS):  SDS 4 Transient Ischemic Dilatation (Normal <1.22):  0.84 Lung/Heart Ratio (Normal <0.45):  0.37  Quantitative Gated Spect Images QGS EDV:  82 ml QGS ESV:  31 ml  Impression Exercise Capacity:  Lexiscan with no exercise. BP Response:  Normal blood pressure response. Clinical Symptoms:  No significant symptoms noted. ECG Impression:  No significant ST segment change suggestive of ischemia. Comparison with Prior Nuclear Study: No images to compare  Overall Impression:  Low risk stress nuclear study. Likely breast artifact with no evidence of ischemia or transmural infarct  LV Ejection Fraction: 62%.  LV Wall Motion:  NL LV Function; NL Wall Motion      Charlton Haws

## 2012-03-25 ENCOUNTER — Ambulatory Visit (HOSPITAL_COMMUNITY): Payer: Managed Care, Other (non HMO) | Attending: Cardiovascular Disease

## 2012-03-25 DIAGNOSIS — R0989 Other specified symptoms and signs involving the circulatory and respiratory systems: Secondary | ICD-10-CM

## 2012-03-25 MED ORDER — TECHNETIUM TC 99M TETROFOSMIN IV KIT
33.0000 | PACK | Freq: Once | INTRAVENOUS | Status: AC | PRN
  Administered 2012-03-25: 33 via INTRAVENOUS

## 2012-03-26 ENCOUNTER — Telehealth: Payer: Self-pay | Admitting: Internal Medicine

## 2012-03-26 ENCOUNTER — Telehealth: Payer: Self-pay | Admitting: *Deleted

## 2012-03-26 NOTE — Telephone Encounter (Signed)
Message copied by Tarri Fuller on Thu Mar 26, 2012 11:29 AM ------      Message from: Vincent, [place] T      Created: Thu Mar 26, 2012  9:50 AM       Please inform patient stress test normal.      Tereso Newcomer, PA-C  9:50 AM 03/26/2012

## 2012-03-26 NOTE — Telephone Encounter (Signed)
pt notified of stress test results normal

## 2012-03-26 NOTE — Telephone Encounter (Signed)
Stress test was negative, patient was already informed by cardiology.

## 2012-03-27 ENCOUNTER — Other Ambulatory Visit: Payer: Self-pay | Admitting: *Deleted

## 2012-03-27 MED ORDER — CARVEDILOL 25 MG PO TABS: 25.0000 mg | ORAL_TABLET | Freq: Two times a day (BID) | ORAL | Status: DC

## 2012-03-27 NOTE — Telephone Encounter (Signed)
Refill done.  

## 2012-04-08 ENCOUNTER — Encounter: Payer: Managed Care, Other (non HMO) | Attending: Internal Medicine | Admitting: *Deleted

## 2012-04-08 ENCOUNTER — Encounter: Payer: Self-pay | Admitting: *Deleted

## 2012-04-08 VITALS — Ht 67.0 in | Wt 254.5 lb

## 2012-04-08 DIAGNOSIS — E669 Obesity, unspecified: Secondary | ICD-10-CM | POA: Insufficient documentation

## 2012-04-08 DIAGNOSIS — Z713 Dietary counseling and surveillance: Secondary | ICD-10-CM | POA: Insufficient documentation

## 2012-04-08 DIAGNOSIS — E785 Hyperlipidemia, unspecified: Secondary | ICD-10-CM

## 2012-04-08 DIAGNOSIS — I10 Essential (primary) hypertension: Secondary | ICD-10-CM

## 2012-04-08 NOTE — Progress Notes (Signed)
  Medical Nutrition Therapy:  Appt start time: 0900 end time:  1000.   Assessment:  Primary concerns today: Hyperlipidemia and weight loss. Patient reports that she has been steadily gaining weight over the last year. She reports a diet high in fat and salt. Her husband does most of the cooking, but she states that he is not very supportive of her dietary changes. She has a [employer] and had been going with her sister, but has not been going recently due to preparations for daughter's wedding. She is interested in starting to exercise again.   Weight: 254.5 pound BMI: 39.9  MEDICATIONS: Tamoxifen, carvedilol, felodipine, simvastatin, omeprazole   DIETARY INTAKE:   Usual eating pattern includes 3 meals and 1 snacks per day.  24-hr recall:  B ( AM): Bacon/ham/tenderloin biscuit with lettuce and mayo (on the way to work) or bagel with cream cheese, coffee with cream and 3 tsp sugar Snk ( AM): None  L ( PM): Usually skips, not hungry Snk ( PM): None D ( PM): Hot dogs with chili slaw, fried pork chops and vegetables (green beans, creamed potatoes, corn, sweet peas), butter and salt, 5:30 pm  Snk ( PM): Chips (1/2 large bag) sometimes Beverages: Coffee 3 cups daily, water  Usual physical activity: None, member of [employer], but hasn't gone in over 2 months  Estimated energy needs: 1500 calories 206 g carbohydrates 75 g protein 42 g fat  Progress Towards Goal(s):  In progress.   Nutritional Diagnosis:  NB-1.1 Food and nutrition-related knowledge deficit As related to hypercholesterolemia and weight loss.  As evidenced by frequent consumption of high fat, high calorie foods.    Intervention:  Nutrition counseling. Discussed a heart healthy diet with the patient, including reducing intake of saturated and trans fats, reducing sodium intake, and increasing fruits and vegetables. We also discussed meal planning, portion control, and label reading for weight loss.   Goals:  1. Monitor  portion sizes of foods.  2. Reduce added fat and salt in foods cooked at home.  3. Read the food label for calories, fat, and sodium.  4. Choose healthy snacks (i.e. Fruit, raw vegetables) in the evening 5. Exercise at the gym at least 3 days/week  Handouts given during visit include:  Heart Healthy Nutrition Therapy, Cooking/Shopping Tips, Label reading  Yellow carb counting handout  Monitoring/Evaluation:  Dietary intake, exercise, and body weight in 1 month(s).

## 2012-04-08 NOTE — Patient Instructions (Signed)
Goals:  1. Monitor portion sizes of foods.  2. Reduce added fat and salt in foods cooked at home.  3. Read the food label for calories, fat, and sodium.  4. Choose healthy snacks (i.e. Fruit, raw vegetables) in the evening 5. Exercise at the gym at least 3 days/week

## 2012-04-20 ENCOUNTER — Ambulatory Visit (INDEPENDENT_AMBULATORY_CARE_PROVIDER_SITE_OTHER): Payer: Managed Care, Other (non HMO) | Admitting: Internal Medicine

## 2012-04-20 ENCOUNTER — Encounter: Payer: Self-pay | Admitting: Internal Medicine

## 2012-04-20 VITALS — BP 128/86 | HR 82 | Temp 97.9°F | Wt 253.0 lb

## 2012-04-20 DIAGNOSIS — R21 Rash and other nonspecific skin eruption: Secondary | ICD-10-CM

## 2012-04-20 MED ORDER — HYDROCORTISONE 2.5 % EX CREA: TOPICAL_CREAM | Freq: Two times a day (BID) | CUTANEOUS | Status: DC

## 2012-04-20 MED ORDER — KETOCONAZOLE 2 % EX CREA: TOPICAL_CREAM | Freq: Every day | CUTANEOUS | Status: DC

## 2012-04-20 NOTE — Progress Notes (Signed)
  Subjective:    Patient ID: Wanda Ortega, female    DOB: 08-23-1961, 51 y.o.   MRN: [id number]  HPI Acute visit: Rash at the chest x 2 months  Rash at the arms and abd x 3 weeks + pruritus   Past Medical History: Breast cancer 04-2011 , L lumpectomy, s/p XRT HTN Dyslipidemia depression colon polyps anemia-- EGD and C  scope 2008; Novasure 2010 for excessive vag bleed   migraines  Past Surgical History: Cholecystectomy NovaSure 01/2009 Lumpectomy (L) July 2012  Family History: CAD - F onset late 81s, several MIs, CABG HTN - F DM - F, sis stroke - GM colon Ca - PGF, age og dx? breast Ca -  G Aunt, great uncle  parkinson-- F  Social History: Married, one daughter birth control, husband had a vasectomy Current Smoker--1 to 1.5 PPD Alcohol use-no Drug use-no Regular exercise--none Diet-- regular  works at an [employer]       Review of Systems Denies fever, chills No arthralgias  No exposure to plants recently    Objective:   Physical Exam  Constitutional: She appears well-developed. No distress.  Skin: She is not diaphoretic.             Assessment & Plan:   Rash, Rash in the arms could be eczema. Rx Hydrocortisone Rash at the chest-abd -->  eczema versus fungal. In addition to hydrocortisone will prescribe ketoconazole.

## 2012-04-20 NOTE — Patient Instructions (Signed)
Hydrocortisone to the chest, abdomen and arms Ketoconazole only to the chest and abdomen Call if not better in 2 weeks

## 2012-04-30 ENCOUNTER — Ambulatory Visit (INDEPENDENT_AMBULATORY_CARE_PROVIDER_SITE_OTHER): Payer: Managed Care, Other (non HMO) | Admitting: Internal Medicine

## 2012-04-30 ENCOUNTER — Encounter: Payer: Self-pay | Admitting: Internal Medicine

## 2012-04-30 VITALS — BP 144/88 | HR 91 | Temp 98.1°F | Wt 251.0 lb

## 2012-04-30 DIAGNOSIS — K5289 Other specified noninfective gastroenteritis and colitis: Secondary | ICD-10-CM

## 2012-04-30 DIAGNOSIS — K529 Noninfective gastroenteritis and colitis, unspecified: Secondary | ICD-10-CM | POA: Insufficient documentation

## 2012-04-30 MED ORDER — ONDANSETRON HCL 4 MG PO TABS: 4.0000 mg | ORAL_TABLET | Freq: Three times a day (TID) | ORAL | Status: AC | PRN

## 2012-04-30 MED ORDER — DICYCLOMINE HCL 20 MG PO TABS: 20.0000 mg | ORAL_TABLET | Freq: Three times a day (TID) | ORAL | Status: DC | PRN

## 2012-04-30 NOTE — Progress Notes (Signed)
  Subjective:    Patient ID: Wanda Ortega, female    DOB: 04-13-1961, 51 y.o.   MRN: [id number]  HPI Acute visit Symptoms started 3 days ago, on and off lower abdominal cramps, diarrhea. Stools are described as watery, small amounts, no blood or mucus. The last few BMs have been almost clear. Patient is not taking any medication for the symptoms.  Past Medical History: Breast cancer 04-2011 , L lumpectomy, s/p SXR Hypertension dyslipidemia colon polyps anemia-- EGD and C  scope 2008; Novasure 2010 for excessive vag bleed   migraines HTN Depression  Past Surgical History: Cholecystectomy NovaSure 01/2009 Lumpectomy (L) July 2012  Family History: CAD - F, several MIs, CABG HTN - F DM - F, sis stroke - GM colon Ca - PGF, age og dx? breast Ca -  G Aunt parkinson-- F  Social History: Married, one daughter birth control, husband had a vasectomy Current Smoker (2 PPD) Alcohol use-no Drug use-no works at an [employer]     Review of Systems No fever, had chills initially. No vomiting. No sick contacts that she can to call. Of notice, she had a colonoscopy 2 weeks ago.    Objective:   Physical Exam  Alert, oriented x3, in some discomfort but not toxic appearing. Vital signs stable. Not pale or jaundiced Lungs are clear to this bilaterally Cardiovascular regular rate and rhythm without a murmur Abdomen with diffuse, mild abdominal tenderness, no rebound, no mass, bowel sounds are slightly increased.      Assessment & Plan:

## 2012-04-30 NOTE — Assessment & Plan Note (Addendum)
Sx likely from AGE, I saw another pt today w/ similar sx. She has diffuse abdominal tenderness to palpation, no peritoneal signs, good bowel sounds. Had a colonoscopy 2 weeks ago and was feeling well up until 3 days ago. I advised the patient to go to the ER if she deteriorates. See instructions.

## 2012-04-30 NOTE — Patient Instructions (Addendum)
Rest, drink lots of fluids, bland diet If nausea-- use zofran If cramps-- use bentyl as needed If diarrhea-- OTC peptobismol Call or go to the ER if symptoms severe, increase stomach pain , nausea , high fever, pain at the appendix area Call if not improving in 3-4 days

## 2012-05-05 ENCOUNTER — Telehealth: Payer: Self-pay | Admitting: *Deleted

## 2012-05-05 NOTE — Telephone Encounter (Signed)
Call pt to check to see if she was feeling any better. Spoke with her husband & he stated that she was feeling 100% better.

## 2012-05-05 NOTE — Telephone Encounter (Signed)
Thank you :)

## 2012-05-11 ENCOUNTER — Ambulatory Visit: Payer: Managed Care, Other (non HMO) | Admitting: *Deleted

## 2012-09-17 ENCOUNTER — Encounter: Payer: Self-pay | Admitting: Internal Medicine

## 2012-09-17 ENCOUNTER — Ambulatory Visit (INDEPENDENT_AMBULATORY_CARE_PROVIDER_SITE_OTHER): Payer: Managed Care, Other (non HMO) | Admitting: Internal Medicine

## 2012-09-17 VITALS — BP 118/80 | HR 82 | Temp 98.2°F | Wt 257.0 lb

## 2012-09-17 DIAGNOSIS — F329 Major depressive disorder, single episode, unspecified: Secondary | ICD-10-CM

## 2012-09-17 DIAGNOSIS — I10 Essential (primary) hypertension: Secondary | ICD-10-CM

## 2012-09-17 DIAGNOSIS — F17200 Nicotine dependence, unspecified, uncomplicated: Secondary | ICD-10-CM

## 2012-09-17 DIAGNOSIS — R079 Chest pain, unspecified: Secondary | ICD-10-CM

## 2012-09-17 LAB — BASIC METABOLIC PANEL
BUN: 13 mg/dL (ref 6–23)
Chloride: 109 mEq/L (ref 96–112)
Glucose, Bld: 113 mg/dL — ABNORMAL HIGH (ref 70–99)
Potassium: 4.3 mEq/L (ref 3.5–5.1)

## 2012-09-17 NOTE — Assessment & Plan Note (Signed)
Well-controlled here in the office, not ambulatory BPs, good medication compliance, recheck a BMP

## 2012-09-17 NOTE — Assessment & Plan Note (Addendum)
Counseled again: ? Chantix (cost is an issue) ? Fee counseling at the hospital she already has an  e- cigarette

## 2012-09-17 NOTE — Assessment & Plan Note (Signed)
Still has issues with her husband, she already got personal counseling, does not like to do more. States her husband would not go to marriage counseling. Encouraged to call me if she ever decides she needs medication.

## 2012-09-17 NOTE — Assessment & Plan Note (Signed)
Chest pain resolved, stress test 03/2012 low risk

## 2012-09-17 NOTE — Progress Notes (Signed)
  Subjective:    Patient ID: Wanda Ortega, female    DOB: 09-Jun-1961, 51 y.o.   MRN: [id number]  HPI Routine office visit Hypertension, good medication compliance, no ambulatory BPs GERD, well controlled as long as she takes PPIs. Was seen with chest pain earlier this year, chest pain resolved, stress test negative.   Past Medical History: Breast cancer 04-2011 , L lumpectomy, s/p SXR Hypertension dyslipidemia colon polyps anemia-- EGD and C  scope 2008; Novasure 2010 for excessive vag bleed   migraines HTN Depression  Past Surgical History: Cholecystectomy NovaSure 01/2009 Lumpectomy (L) July 2012  Family History: CAD - F, several MIs, CABG HTN - F DM - F, sis stroke - GM colon Ca - PGF, age og dx? breast Ca -  G Aunt parkinson-- F  Social History: Married, one daughter birth control, husband had a vasectomy Current Smoker (2 PPD) Alcohol use-no Drug use-no works at an [employer]     Review of Systems Overall doing about the same as before, still has some marital issues. See assessment and plan. Complaining of right shoulder pain, at the deltoid area, it is worse with certain movements specifically when she reaches across. She continue smoking around 2 packs a day, has cut down to some extent by using the electronic cigarette. She is more active, going to the gym, fortunately most of the gym exercises do not affect his shoulder.     Objective:   Physical Exam General -- alert, well-developed, and overweight appearing. No apparent distress.  Lungs -- normal respiratory effort, no intercostal retractions, no accessory muscle use, and normal breath sounds.   Heart-- normal rate, regular rhythm, no murmur, and no gallop.   Extremities--  Left shoulder normal. Right shoulder range of motion is normal, no tender at the a.c. joint, pain elicited only by reaching across. Psych-- Cognition and judgment appear intact. Alert and cooperative with normal attention  span and concentration.  not anxious appearing and not depressed appearing.      Assessment & Plan:  Strongly declined a flu shot.  Shoulder pain, Deltoid sprain?. Recommend to avoid any movement that triggers the pain, ibuprofen and ice as needed. If not better let me know.

## 2012-09-21 ENCOUNTER — Encounter: Payer: Self-pay | Admitting: *Deleted

## 2012-11-16 ENCOUNTER — Other Ambulatory Visit: Payer: Self-pay | Admitting: *Deleted

## 2012-11-16 DIAGNOSIS — E7800 Pure hypercholesterolemia, unspecified: Secondary | ICD-10-CM

## 2012-11-16 MED ORDER — SIMVASTATIN 40 MG PO TABS: 40.0000 mg | ORAL_TABLET | Freq: Every evening | ORAL | Status: DC

## 2012-11-16 NOTE — Telephone Encounter (Signed)
Refill for simvastatin sent [facility]

## 2012-12-15 ENCOUNTER — Ambulatory Visit: Payer: Managed Care, Other (non HMO) | Admitting: Internal Medicine

## 2013-01-04 ENCOUNTER — Telehealth: Payer: Self-pay | Admitting: Internal Medicine

## 2013-01-04 NOTE — Telephone Encounter (Signed)
LM On triage line 12:11pm  needs refill of felodipine-sent to [facility], their # [phone]--pt cb# [phone] or [phone]

## 2013-01-05 MED ORDER — FELODIPINE ER 5 MG PO TB24: 5.0000 mg | ORAL_TABLET | Freq: Every day | ORAL | Status: DC

## 2013-01-05 NOTE — Telephone Encounter (Signed)
Refill done.  

## 2013-01-14 ENCOUNTER — Telehealth: Payer: Self-pay | Admitting: Internal Medicine

## 2013-01-14 MED ORDER — CARVEDILOL 25 MG PO TABS: 25.0000 mg | ORAL_TABLET | Freq: Two times a day (BID) | ORAL | Status: DC

## 2013-01-14 NOTE — Telephone Encounter (Signed)
Refill: Coreg tab 25mg .

## 2013-01-14 NOTE — Telephone Encounter (Signed)
Refill done.  

## 2013-02-26 ENCOUNTER — Ambulatory Visit (INDEPENDENT_AMBULATORY_CARE_PROVIDER_SITE_OTHER): Payer: Managed Care, Other (non HMO) | Admitting: Internal Medicine

## 2013-02-26 ENCOUNTER — Encounter: Payer: Self-pay | Admitting: Internal Medicine

## 2013-02-26 VITALS — BP 130/84 | HR 94 | Temp 98.1°F | Ht 67.5 in | Wt 259.0 lb

## 2013-02-26 DIAGNOSIS — E7800 Pure hypercholesterolemia, unspecified: Secondary | ICD-10-CM

## 2013-02-26 DIAGNOSIS — I10 Essential (primary) hypertension: Secondary | ICD-10-CM

## 2013-02-26 DIAGNOSIS — F17200 Nicotine dependence, unspecified, uncomplicated: Secondary | ICD-10-CM

## 2013-02-26 DIAGNOSIS — Z0000 Encounter for general adult medical examination without abnormal findings: Secondary | ICD-10-CM

## 2013-02-26 DIAGNOSIS — F329 Major depressive disorder, single episode, unspecified: Secondary | ICD-10-CM

## 2013-02-26 MED ORDER — SIMVASTATIN 40 MG PO TABS: 40.0000 mg | ORAL_TABLET | Freq: Every evening | ORAL | Status: DC

## 2013-02-26 MED ORDER — CARVEDILOL 25 MG PO TABS: 12.5000 mg | ORAL_TABLET | Freq: Two times a day (BID) | ORAL | Status: DC

## 2013-02-26 MED ORDER — FELODIPINE ER 5 MG PO TB24: 5.0000 mg | ORAL_TABLET | Freq: Every day | ORAL | Status: DC

## 2013-02-26 NOTE — Assessment & Plan Note (Signed)
Needs higher dose of clonidine for hot flashes, decrease coreg to 1/2 tab bid

## 2013-02-26 NOTE — Assessment & Plan Note (Addendum)
Td 2008 sees gyn, Dr Wylene Simmer in [facility]  had a EGD - Cscope in the past d/t iron def ( 2008 ), had one colon polyp, Dr Dan Humphreys in [place] Cscope 5-13, polyp, tics, next 3-5 years depending on pathology Extensive counseling about tobacco, diet and exercise. Labs

## 2013-02-26 NOTE — Patient Instructions (Signed)
Come back in 6 months

## 2013-02-26 NOTE — Assessment & Plan Note (Addendum)
Counseled, relationship w/  Husband better Start wellbutrin ? Unable to take as it interact w/  nolvadex Declined SSRI

## 2013-02-26 NOTE — Progress Notes (Signed)
  Subjective:    Patient ID: Wanda Ortega, female    DOB: 1961/10/25, 52 y.o.   MRN: [id number]  HPI CPX  Past Medical History  Diagnosis Date  . Hypertension   . Dyslipidemia   . Colon polyps   . Anemia     EGD and Cscope 2008; Novasure 2010 for excessive vag bleed  . Migraines   . Depression   . Breast cancer 05/21/2011   Past Surgical History  Procedure Laterality Date  . Cholecystectomy    . Novasure ablation  01/2009  . Breast lumpectomy Left July 2012     Family History: CAD - F, several MIs, onselt late 12s, CABG HTN - F DM - F, sis stroke - GM colon Ca - PGF, age of dx? breast Ca -  G Aunt parkinson-- F  Social History: Married, one daughter birth control, husband had a vasectomy Current Smoker (2 PPD) Alcohol use-no Drug use-no works at an [employer]     Review of Systems In general doing well, has severe hot  flashes, oncology recommended to increase clonidine to twice a day, wonder if that is ok with her BP. meds Does not take ambulatory BPs often. No chest or shortness or breath No nausea, vomiting, diarrhea. No blood in the stools. No dysuria or gross hematuria. Having some bladder spasms, underwent a workup by gynecology. History of depression, currently doing "okay", relationship w/ husband better. Still smokes, reports she sees the dentist regularly.    Objective:   Physical Exam BP 130/84  Pulse 94  Temp(Src) 98.1 F (36.7 C) (Oral)  Ht 5' 7.5" (1.715 m)  Wt 259 lb (117.482 kg)  BMI 39.94 kg/m2  SpO2 97%  General -- alert, well-developed  .   Lungs -- normal respiratory effort, no intercostal retractions, no accessory muscle use, and normal breath sounds.   Heart-- normal rate, regular rhythm, no murmur, and no gallop.   Abdomen--soft, non-tender, no distention, no masses, no HSM, no guarding, and no rigidity.   Extremities-- no pretibial edema bilaterally  Neurologic-- alert & oriented X3 and strength normal in all  extremities. Psych-- Cognition and judgment appear intact. Alert and cooperative with normal attention span and concentration.  not anxious appearing and not depressed appearing.      Assessment & Plan:

## 2013-02-26 NOTE — Assessment & Plan Note (Addendum)
Counseled, risk of early death, cancer, strokes, MIs discussed.   wellbutrin interacts w/  Nolvadex, to think about chantix, the patch

## 2013-03-07 ENCOUNTER — Telehealth: Payer: Self-pay | Admitting: Internal Medicine

## 2013-03-07 NOTE — Telephone Encounter (Addendum)
Several labs ordered at last visit, I don't see the results, please arrange labs

## 2013-03-10 NOTE — Telephone Encounter (Signed)
Pt coming tomorrow, 03/11/13, for labs.

## 2013-03-10 NOTE — Telephone Encounter (Signed)
thx

## 2013-03-11 ENCOUNTER — Other Ambulatory Visit: Payer: Managed Care, Other (non HMO)

## 2013-03-11 LAB — CBC WITH DIFFERENTIAL/PLATELET
Basophils Relative: 0.3 % (ref 0.0–3.0)
Eosinophils Absolute: 0.3 10*3/uL (ref 0.0–0.7)
Eosinophils Relative: 3.2 % (ref 0.0–5.0)
Hemoglobin: 14.1 g/dL (ref 12.0–15.0)
Lymphocytes Relative: 31.7 % (ref 12.0–46.0)
MCHC: 34.3 g/dL (ref 30.0–36.0)
Neutro Abs: 4.4 10*3/uL (ref 1.4–7.7)
Neutrophils Relative %: 56.8 % (ref 43.0–77.0)
RBC: 4.71 Mil/uL (ref 3.87–5.11)
WBC: 7.8 10*3/uL (ref 4.5–10.5)

## 2013-03-11 LAB — COMPREHENSIVE METABOLIC PANEL
AST: 24 U/L (ref 0–37)
Alkaline Phosphatase: 52 U/L (ref 39–117)
BUN: 12 mg/dL (ref 6–23)
Calcium: 8.5 mg/dL (ref 8.4–10.5)
Chloride: 105 mEq/L (ref 96–112)
Creatinine, Ser: 1.1 mg/dL (ref 0.4–1.2)
Glucose, Bld: 115 mg/dL — ABNORMAL HIGH (ref 70–99)

## 2013-03-11 LAB — TSH: TSH: 1.1 u[IU]/mL (ref 0.35–5.50)

## 2013-03-11 LAB — LIPID PANEL
HDL: 29.8 mg/dL — ABNORMAL LOW (ref >39.00)
LDL Cholesterol: 86 mg/dL (ref 0–99)
Total CHOL/HDL Ratio: 5
Triglycerides: 189 mg/dL — ABNORMAL HIGH (ref 0.0–149.0)
VLDL: 37.8 mg/dL (ref 0.0–40.0)

## 2013-03-15 ENCOUNTER — Ambulatory Visit: Payer: Managed Care, Other (non HMO)

## 2013-03-15 DIAGNOSIS — R7309 Other abnormal glucose: Secondary | ICD-10-CM

## 2013-08-27 ENCOUNTER — Ambulatory Visit: Payer: Managed Care, Other (non HMO) | Admitting: Internal Medicine

## 2013-09-24 ENCOUNTER — Ambulatory Visit: Payer: Managed Care, Other (non HMO) | Admitting: Internal Medicine

## 2013-10-08 ENCOUNTER — Encounter: Payer: Self-pay | Admitting: Internal Medicine

## 2013-10-08 ENCOUNTER — Ambulatory Visit (INDEPENDENT_AMBULATORY_CARE_PROVIDER_SITE_OTHER): Payer: Managed Care, Other (non HMO) | Admitting: Internal Medicine

## 2013-10-08 VITALS — BP 153/100 | HR 78 | Temp 97.8°F | Resp 18 | Wt 251.0 lb

## 2013-10-08 DIAGNOSIS — F329 Major depressive disorder, single episode, unspecified: Secondary | ICD-10-CM

## 2013-10-08 DIAGNOSIS — R7309 Other abnormal glucose: Secondary | ICD-10-CM

## 2013-10-08 DIAGNOSIS — R7303 Prediabetes: Secondary | ICD-10-CM

## 2013-10-08 DIAGNOSIS — E119 Type 2 diabetes mellitus without complications: Secondary | ICD-10-CM | POA: Insufficient documentation

## 2013-10-08 DIAGNOSIS — R42 Dizziness and giddiness: Secondary | ICD-10-CM

## 2013-10-08 LAB — HEMOGLOBIN A1C: Hgb A1c MFr Bld: 6.2 % — ABNORMAL HIGH (ref <5.7)

## 2013-10-08 NOTE — Assessment & Plan Note (Signed)
Lost a pet 2 months ago, more emotional than before, not suicidal. We again discussed SSRIs, quite reluctant to take any medication at this point. I also encourage counseling and she is not enthusiastic about it.

## 2013-10-08 NOTE — Assessment & Plan Note (Signed)
Recent A1c 6.4, did not agree to see and nutritionist, recommend self learning, see instructions. Discussed exercise. Labs. BP today slightly elevated but she is quite emotional, or other BPs within normal. Recheck BP when she comes back

## 2013-10-08 NOTE — Progress Notes (Signed)
  Subjective:    Patient ID: Wanda Ortega, female    DOB: [birth year], 52 y.o.   MRN: [id number]  HPI ROV Visit last time she was here, her A1c came back 6.4. Did not see the nutritionist. Her diet and exercise have not changed much. She lost a pet 2 months ago, she is a slightly more depressed. Complains of dizziness for 2 weeks, described as a spinning, usually triggered by getting up, no problems with turning her head. Symptoms last few seconds and episodes have been less frequent lately. Breast cancer, on tamoxifen, + hot flashes and also + myalgias since she started tamoxifen.  Past Medical History  Diagnosis Date  . Hypertension   . Dyslipidemia   . Colon polyps   . Anemia     EGD and Cscope 2008; Novasure 2010 for excessive vag bleed  . Migraines   . Depression   . Breast cancer 05/21/2011   Past Surgical History  Procedure Laterality Date  . Cholecystectomy    . Novasure ablation  01/2009  . Breast lumpectomy Left July 2012    History   Social History  . Marital Status: Married    Spouse Name: N/A    Number of Children: 1  . Years of Education: N/A   Occupational History  . works at an [employer]    .     Social History Main Topics  . Smoking status: Current Every Day Smoker -- 2.00 packs/day  . Smokeless tobacco: Not on file     Comment: 2 PPD  . Alcohol Use: No  . Drug Use: No  . Sexual Activity: Yes    Birth Control/ Protection: Surgical     Comment: husband had vasectomy    Other Topics Concern  . Not on file   Social History Narrative   birth control, husband had a vasectomy          Family History: CAD - F, several MIs, onselt late 22s, CABG HTN - F DM - F, sis stroke - GM colon Ca - PGF, age of dx? breast Ca -  G Aunt parkinson-- F     Review of Systems Denies nausea vomiting daily. No fever, chills, sore throat or cough. With the onset of dizziness she did have some headache mostly on the right side than on the right year  but not year discharge per se.  Denies any suicidal ideas    Objective:   Physical Exam  BP 153/100  Pulse 78  Temp(Src) 97.8 F (36.6 C) (Tympanic)  Resp 18  Wt 251 lb (113.853 kg)  SpO2 98% General -- alert, well-developed, very emotional.  Neck --  normal carotid pulse HEENT-- Not pale.   Lungs -- normal respiratory effort, no intercostal retractions, no accessory muscle use, and normal breath sounds.  Heart-- normal rate, regular rhythm, no murmur.   Neurologic--  alert & oriented X3. Speech normal, gait normal, strength normal in all extremities.  EOMI, PERLA   Psych-- Cognition and judgment appear intact. Cooperative with normal attention span and concentration. No anxious appearing , no depressed appearing.      Assessment & Plan:   Declined a flu shot Dizziness -- no red flag symptoms, neurological exam essentially normal. Recommend observation.

## 2013-10-08 NOTE — Patient Instructions (Addendum)
Get your blood work before you leave  Next visit in 6 months  for a physical exam .Fasting Please make an appointment    Call if the dizziness is not gradually improving or if it gets worse   [facility] web site for Diabetes [email or web address]  "The [facility] Diabetes diet" book

## 2013-10-08 NOTE — Progress Notes (Signed)
Pre-visit discussion using our clinic review tool. No additional management support is needed unless otherwise documented below in the visit note.  

## 2013-10-08 NOTE — Assessment & Plan Note (Signed)
On tamoxifen, having hot flashes and joint aches, plans to discuss symptoms w/ oncology

## 2013-10-09 ENCOUNTER — Encounter: Payer: Self-pay | Admitting: Internal Medicine

## 2013-10-11 ENCOUNTER — Encounter: Payer: Self-pay | Admitting: *Deleted

## 2013-10-21 ENCOUNTER — Telehealth: Payer: Self-pay | Admitting: *Deleted

## 2013-10-21 NOTE — Telephone Encounter (Signed)
Rest, fluids, Tylenol Robitussin-DM OTC. If not better in few days or symptoms are severe needs to be seen

## 2013-10-21 NOTE — Telephone Encounter (Signed)
Called and spoke with patient and informed her of Dr. Leta Jungling recommendations.

## 2013-10-21 NOTE — Telephone Encounter (Signed)
Patient called and stated that she is experiencing chest congestion. She would like to know the best treatmeant OTC? Please advise.

## 2014-02-02 ENCOUNTER — Encounter: Payer: Self-pay | Admitting: Internal Medicine

## 2014-02-16 LAB — HM PAP SMEAR: HM Pap smear: NORMAL

## 2014-03-15 ENCOUNTER — Encounter: Payer: Managed Care, Other (non HMO) | Admitting: Internal Medicine

## 2014-03-21 ENCOUNTER — Telehealth: Payer: Self-pay | Admitting: Internal Medicine

## 2014-03-21 DIAGNOSIS — E7800 Pure hypercholesterolemia, unspecified: Secondary | ICD-10-CM

## 2014-03-21 MED ORDER — SIMVASTATIN 40 MG PO TABS: 40.0000 mg | ORAL_TABLET | Freq: Every evening | ORAL | Status: DC

## 2014-03-21 MED ORDER — CARVEDILOL 25 MG PO TABS: 12.5000 mg | ORAL_TABLET | Freq: Two times a day (BID) | ORAL | Status: DC

## 2014-03-21 NOTE — Telephone Encounter (Signed)
Patient called requesting 30 day supply of simvastatin and carvedilol be sent to [facility] on [address]. [place] She has upcoming appt on 04/27/14, but she does NOT want this sent to [place].

## 2014-03-21 NOTE — Telephone Encounter (Signed)
rx sent to [facility] for 30 day supply

## 2014-04-26 ENCOUNTER — Other Ambulatory Visit: Payer: Self-pay | Admitting: *Deleted

## 2014-04-26 DIAGNOSIS — I10 Essential (primary) hypertension: Secondary | ICD-10-CM

## 2014-04-26 MED ORDER — CARVEDILOL 25 MG PO TABS: 12.5000 mg | ORAL_TABLET | Freq: Two times a day (BID) | ORAL | Status: DC

## 2014-04-26 NOTE — Telephone Encounter (Signed)
Refill for Coreg sent to [facility] on [address]

## 2014-04-27 ENCOUNTER — Encounter: Payer: Self-pay | Admitting: Internal Medicine

## 2014-04-27 ENCOUNTER — Telehealth: Payer: Self-pay | Admitting: *Deleted

## 2014-04-27 ENCOUNTER — Ambulatory Visit (INDEPENDENT_AMBULATORY_CARE_PROVIDER_SITE_OTHER): Payer: Managed Care, Other (non HMO) | Admitting: Internal Medicine

## 2014-04-27 VITALS — BP 161/88 | HR 80 | Temp 98.4°F | Ht 67.2 in | Wt 255.0 lb

## 2014-04-27 DIAGNOSIS — I10 Essential (primary) hypertension: Secondary | ICD-10-CM

## 2014-04-27 DIAGNOSIS — F329 Major depressive disorder, single episode, unspecified: Secondary | ICD-10-CM

## 2014-04-27 DIAGNOSIS — Z0000 Encounter for general adult medical examination without abnormal findings: Secondary | ICD-10-CM

## 2014-04-27 DIAGNOSIS — F3289 Other specified depressive episodes: Secondary | ICD-10-CM

## 2014-04-27 DIAGNOSIS — E7800 Pure hypercholesterolemia, unspecified: Secondary | ICD-10-CM

## 2014-04-27 DIAGNOSIS — R7303 Prediabetes: Secondary | ICD-10-CM

## 2014-04-27 MED ORDER — CARVEDILOL 25 MG PO TABS: 25.0000 mg | ORAL_TABLET | Freq: Two times a day (BID) | ORAL | Status: DC

## 2014-04-27 MED ORDER — CLONIDINE HCL 0.1 MG PO TABS: 0.1000 mg | ORAL_TABLET | Freq: Two times a day (BID) | ORAL | Status: DC

## 2014-04-27 MED ORDER — FELODIPINE ER 5 MG PO TB24: 5.0000 mg | ORAL_TABLET | Freq: Every day | ORAL | Status: DC

## 2014-04-27 MED ORDER — SIMVASTATIN 40 MG PO TABS: 40.0000 mg | ORAL_TABLET | Freq: Every evening | ORAL | Status: DC

## 2014-04-27 NOTE — Assessment & Plan Note (Addendum)
Td 2008 pnm shot -- declined   sees gyn, Dr Bradyn Gust in [facility]: 08/2013 normal DEXA-- 10-2010 per gyn, normal  had a EGD - Cscope in the past d/t iron def ( 2008 ), had one colon polyp, Dr Gilford Rile in [place] 5-13, Dr Shana Chute , polyp, tics, next 3-5 years depending on pathology  Ccounseling about tobacco (risk including CAD-cancer discussed, states will see a dentist), diet and exercise. Labs

## 2014-04-27 NOTE — Patient Instructions (Signed)
Make an appointment for blood work, fasting CMP FLP CBC TSH -- dx v70 A1c --- dx v70  Take BP meds as prescribed  Check the  blood pressure 2 or 3 times a  week be sure it is between 110/60 and 140/85. Ideal blood pressure is 120/80. If it is consistently higher or lower, let me know   Next visit 2 months

## 2014-04-27 NOTE — Telephone Encounter (Signed)
Medication List and Allergies: Reviewed and updated  90 Day supply/Mail order: [place] 90 day Local pharmacy: [facility], 30 day Immunizations Due:   A/P FH/PSH or Personal History: Reviewed and updated  Flu Vaccine:  Tdap: 12/2006 Td PNA:  Shingles: CCS: Dr. Shana Chute, [facility] 03/2012 2 polyps removed repeat 3-5 years Pap: 08/2009 normal MMG: 08/2013 normal BD: 10/2010 Normal per pt  To discuss with provider: Wants to change coreg to 1 tablet BID

## 2014-04-27 NOTE — Progress Notes (Signed)
Subjective:    Patient ID: Wanda Ortega, female    DOB: 08/08/1961, 53 y.o.   MRN: [id number]  DOS:  04/27/2014 Type of  Visit: CPX We also discussed other issues, see a/p   ROS  Denies chest pain or difficulty breathing No nausea, vomiting, diarrhea or blood in the stools No cough, sputum production, hemoptysis or wheezing. Anxiety and depression not well controlled, denies any suicidal ideas. She ran out of clonidine few days ago and is taking carvedilol inconsistently for the last few days; BP elevated as high as 164/104, self increased coreg dose ;  has developed mild tingling in the forehead; when she was taking her medication regularly, her BP was within normal. No dizziness, diplopia, slurred speech or motor deficits   Past Medical History  Diagnosis Date  . Hypertension   . Dyslipidemia   . Colon polyps   . Anemia     EGD and Cscope 2008; Novasure 2010 for excessive vag bleed  . Migraines   . Depression   . Breast cancer 05/21/2011  . Contraception     husband's vasectomy    Past Surgical History  Procedure Laterality Date  . Cholecystectomy    . Novasure ablation  01/2009  . Breast lumpectomy Left July 2012     History   Social History  . Marital Status: Married    Spouse Name: N/A    Number of Children: 1  . Years of Education: N/A   Occupational History  . not working at present    .     Social History Main Topics  . Smoking status: Current Every Day Smoker -- 2.00 packs/day  . Smokeless tobacco: Never Used     Comment: 2 PPD  . Alcohol Use: No  . Drug Use: No  . Sexual Activity: Yes    Birth Control/ Protection: Surgical     Comment: husband had vasectomy    Other Topics Concern  . Not on file   Social History Narrative   birth control, husband had a vasectomy           Family History  Problem Relation Age of Onset  . Coronary artery disease Father     several MIs, CABG  . Hypertension Father   . Diabetes Father   .  Parkinsonism Father   . Diabetes Sister   . Stroke      GM  . Colon cancer Paternal Grandfather   . Stroke Sister   . Breast cancer      G aunt        Medication List       This list is accurate as of: 04/27/14 11:59 PM.  Always use your most recent med list.               carvedilol 25 MG tablet  Commonly known as:  COREG  Take 1 tablet (25 mg total) by mouth 2 (two) times daily with a meal.     cloNIDine 0.1 MG tablet  Commonly known as:  CATAPRES  Take 1 tablet (0.1 mg total) by mouth 2 (two) times daily.     felodipine 5 MG 24 hr tablet  Commonly known as:  PLENDIL  Take 1 tablet (5 mg total) by mouth daily.     omeprazole 20 MG capsule  Commonly known as:  PRILOSEC  Take 20 mg by mouth daily.     simvastatin 40 MG tablet  Commonly known as:  ZOCOR  Take 1 tablet (  40 mg total) by mouth every evening.     tamoxifen 20 MG tablet  Commonly known as:  NOLVADEX  Take 20 mg by mouth daily.     TOVIAZ 4 MG Tb24 tablet  Generic drug:  fesoterodine  Take 4 mg by mouth daily.           Objective:   Physical Exam BP 161/88  Pulse 80  Temp(Src) 98.4 F (36.9 C)  Ht 5' 7.2" (1.707 m)  Wt 255 lb (115.667 kg)  BMI 39.70 kg/m2  SpO2 98% General -- alert, well-developed, NAD.  Neck --no thyromegaly  HEENT-- Not pale.  Lungs -- normal respiratory effort, no intercostal retractions, no accessory muscle use, and normal breath sounds.  Heart-- normal rate, regular rhythm, no murmur.  Abdomen-- Not distended, good bowel sounds,soft, non-tender. Extremities-- no pretibial edema bilaterally  Neurologic--  alert & oriented X3. Speech normal, gait appropriate for age, strength symmetric and appropriate for age.  Psych-- Cognition and judgment appear intact. Cooperative with normal attention span and concentration. No anxious or depressed appearing.     Assessment & Plan:

## 2014-04-27 NOTE — Progress Notes (Signed)
Pre visit review using our clinic review tool, if applicable. No additional management support is needed unless otherwise documented below in the visit note. 

## 2014-04-27 NOTE — Assessment & Plan Note (Signed)
Not doing well with diet and exercise Again encourage to improve her diet and stay active. Labs

## 2014-04-27 NOTE — Assessment & Plan Note (Addendum)
Out of clonidin x few days, been taking carvedilol inconsistently but when she ran out of clonidine she self increased her dose of carvedilol, BP slightly elevated. When she was taking all her medications BP was normal. plan Refill clonidin, self increased carvedilol to one tablet twice a day--> stay on higher dose  Monitor BPs. Has mild headache, neurological exam is grossly intact. Will call if BP not improving

## 2014-04-27 NOTE — Assessment & Plan Note (Signed)
See previous entries, not well controlled, I  encouraged to talk with a counselor

## 2014-04-28 ENCOUNTER — Other Ambulatory Visit (INDEPENDENT_AMBULATORY_CARE_PROVIDER_SITE_OTHER): Payer: Managed Care, Other (non HMO)

## 2014-04-28 DIAGNOSIS — Z0000 Encounter for general adult medical examination without abnormal findings: Secondary | ICD-10-CM

## 2014-04-28 LAB — HEMOGLOBIN A1C: Hgb A1c MFr Bld: 6.7 % — ABNORMAL HIGH (ref 4.6–6.5)

## 2014-04-28 LAB — CBC WITH DIFFERENTIAL/PLATELET
BASOS ABS: 0.1 10*3/uL (ref 0.0–0.1)
Basophils Relative: 1.6 % (ref 0.0–3.0)
EOS ABS: 0.3 10*3/uL (ref 0.0–0.7)
Eosinophils Relative: 3.7 % (ref 0.0–5.0)
HCT: 41.3 % (ref 36.0–46.0)
Hemoglobin: 13.8 g/dL (ref 12.0–15.0)
Lymphocytes Relative: 40.6 % (ref 12.0–46.0)
Lymphs Abs: 3.6 10*3/uL (ref 0.7–4.0)
MCHC: 33.3 g/dL (ref 30.0–36.0)
MCV: 87.5 fl (ref 78.0–100.0)
MONOS PCT: 6.6 % (ref 3.0–12.0)
Monocytes Absolute: 0.6 10*3/uL (ref 0.1–1.0)
NEUTROS PCT: 47.5 % (ref 43.0–77.0)
Neutro Abs: 4.2 10*3/uL (ref 1.4–7.7)
PLATELETS: 232 10*3/uL (ref 150.0–400.0)
RBC: 4.73 Mil/uL (ref 3.87–5.11)
RDW: 13.6 % (ref 11.5–15.5)
WBC: 8.8 10*3/uL (ref 4.0–10.5)

## 2014-04-28 LAB — COMPREHENSIVE METABOLIC PANEL
ALK PHOS: 50 U/L (ref 39–117)
ALT: 22 U/L (ref 0–35)
AST: 30 U/L (ref 0–37)
Albumin: 3.3 g/dL — ABNORMAL LOW (ref 3.5–5.2)
BUN: 13 mg/dL (ref 6–23)
CHLORIDE: 105 meq/L (ref 96–112)
CO2: 26 mEq/L (ref 19–32)
CREATININE: 1.1 mg/dL (ref 0.4–1.2)
Calcium: 8.8 mg/dL (ref 8.4–10.5)
GFR: 58.79 mL/min — ABNORMAL LOW (ref >60.00)
Glucose, Bld: 123 mg/dL — ABNORMAL HIGH (ref 70–99)
POTASSIUM: 4.2 meq/L (ref 3.5–5.1)
Sodium: 139 mEq/L (ref 135–145)
Total Bilirubin: 0.1 mg/dL — ABNORMAL LOW (ref 0.2–1.2)
Total Protein: 6.6 g/dL (ref 6.0–8.3)

## 2014-04-28 LAB — LIPID PANEL
Cholesterol: 142 mg/dL (ref 0–200)
HDL: 34.9 mg/dL — AB (ref >39.00)
LDL CALC: 70 mg/dL (ref 0–99)
NONHDL: 107.1
TRIGLYCERIDES: 187 mg/dL — AB (ref 0.0–149.0)
Total CHOL/HDL Ratio: 4
VLDL: 37.4 mg/dL (ref 0.0–40.0)

## 2014-04-28 LAB — TSH: TSH: 1.91 u[IU]/mL (ref 0.35–4.50)

## 2014-05-21 ENCOUNTER — Other Ambulatory Visit: Payer: Self-pay | Admitting: Internal Medicine

## 2014-05-21 DIAGNOSIS — E785 Hyperlipidemia, unspecified: Secondary | ICD-10-CM

## 2014-05-23 NOTE — Telephone Encounter (Signed)
Refill for zocor sent to [facility] in [place]

## 2014-06-27 ENCOUNTER — Encounter: Payer: Self-pay | Admitting: Internal Medicine

## 2014-06-27 ENCOUNTER — Ambulatory Visit (INDEPENDENT_AMBULATORY_CARE_PROVIDER_SITE_OTHER): Payer: Managed Care, Other (non HMO) | Admitting: Internal Medicine

## 2014-06-27 VITALS — BP 124/76 | HR 83 | Temp 98.2°F | Wt 254.0 lb

## 2014-06-27 DIAGNOSIS — R7309 Other abnormal glucose: Secondary | ICD-10-CM

## 2014-06-27 DIAGNOSIS — I10 Essential (primary) hypertension: Secondary | ICD-10-CM

## 2014-06-27 DIAGNOSIS — R7303 Prediabetes: Secondary | ICD-10-CM

## 2014-06-27 DIAGNOSIS — E669 Obesity, unspecified: Secondary | ICD-10-CM

## 2014-06-27 NOTE — Assessment & Plan Note (Signed)
Last A1c was satisfactory, discussed diet and exercise

## 2014-06-27 NOTE — Progress Notes (Signed)
Subjective:    Patient ID: Wanda Ortega, female    DOB: [birth year], 53 y.o.   MRN: [id number]  DOS:  06/27/2014 Type of visit - description: f/u History: Since last time she was here she is doing well. BP today is excellent, reports that ambulatory BPs has been very good. Still having A hard time eating healthy and staying active but is getting help from somebody from her insurance  ROS Occasionally the left nostril gets congestion when she lies down on the right. Denies fever, chills, runny nose or nasal discharge. Anxiety and depression okay, having some issues with her daughter  Past Medical History  Diagnosis Date  . Hypertension   . Dyslipidemia   . Colon polyps   . Anemia     EGD and Cscope 2008; Novasure 2010 for excessive vag bleed  . Migraines   . Depression   . Breast cancer 05/21/2011  . Contraception     husband's vasectomy    Past Surgical History  Procedure Laterality Date  . Cholecystectomy    . Novasure ablation  01/2009  . Breast lumpectomy Left July 2012     History   Social History  . Marital Status: Married    Spouse Name: N/A    Number of Children: 1  . Years of Education: N/A   Occupational History  . not working at present    .     Social History Main Topics  . Smoking status: Current Every Day Smoker -- 2.00 packs/day  . Smokeless tobacco: Never Used     Comment: 2 PPD  . Alcohol Use: No  . Drug Use: No  . Sexual Activity: Yes    Birth Control/ Protection: Surgical     Comment: husband had vasectomy    Other Topics Concern  . Not on file   Social History Narrative   birth control, husband had a vasectomy              Medication List       This list is accurate as of: 06/27/14 11:59 PM.  Always use your most recent med list.               carvedilol 25 MG tablet  Commonly known as:  COREG  Take 1 tablet (25 mg total) by mouth 2 (two) times daily with a meal.     cloNIDine 0.1 MG tablet  Commonly known as:   CATAPRES  Take 1 tablet (0.1 mg total) by mouth 2 (two) times daily.     felodipine 5 MG 24 hr tablet  Commonly known as:  PLENDIL  Take 1 tablet (5 mg total) by mouth daily.     omeprazole 20 MG capsule  Commonly known as:  PRILOSEC  Take 20 mg by mouth daily.     simvastatin 40 MG tablet  Commonly known as:  ZOCOR  take 1 tablet by mouth every evening     tamoxifen 20 MG tablet  Commonly known as:  NOLVADEX  Take 20 mg by mouth daily.     TOVIAZ 4 MG Tb24 tablet  Generic drug:  fesoterodine  Take 4 mg by mouth daily.           Objective:   Physical Exam BP 124/76  Pulse 83  Temp(Src) 98.2 F (36.8 C)  Wt 254 lb (115.214 kg)  SpO2 100%  General -- alert, well-developed, NAD.   Lungs -- normal respiratory effort, no intercostal retractions, no accessory muscle use, and  normal breath sounds.  Heart-- normal rate, regular rhythm, no murmur.   Extremities-- no pretibial edema bilaterally  Neurologic--  alert & oriented X3. Speech normal, gait appropriate for age, strength symmetric and appropriate for age.    Psych-- Cognition and judgment appear intact. Cooperative with normal attention span and concentration. No anxious or depressed appearing.       Assessment & Plan:   Today , I spent more than  15  min with the patient: >50% of the time counseling regards Diet, exercise, interaction between felodipine and  Grapefruit

## 2014-06-27 NOTE — Assessment & Plan Note (Signed)
Most of today's visit was focus on diet exercise counseling, she likes to eat grapefruit but is unable to because a interaction  with felodipine. Her BP is so well controlled I don't  like to change her BP medication, she agrees.

## 2014-06-27 NOTE — Patient Instructions (Signed)
Next visit is for routine check up in 6 to 8 months  No need to come back fasting Please make an appointment

## 2014-06-27 NOTE — Assessment & Plan Note (Signed)
Under excellent control, no change

## 2014-06-27 NOTE — Progress Notes (Signed)
Pre visit review using our clinic review tool, if applicable. No additional management support is needed unless otherwise documented below in the visit note. 

## 2014-09-02 ENCOUNTER — Other Ambulatory Visit: Payer: Self-pay

## 2014-09-14 ENCOUNTER — Ambulatory Visit (INDEPENDENT_AMBULATORY_CARE_PROVIDER_SITE_OTHER): Payer: Managed Care, Other (non HMO) | Admitting: Physician Assistant

## 2014-09-14 ENCOUNTER — Ambulatory Visit (HOSPITAL_BASED_OUTPATIENT_CLINIC_OR_DEPARTMENT_OTHER)
Admission: RE | Admit: 2014-09-14 | Discharge: 2014-09-14 | Disposition: A | Discharge status: Home / Self Care | Payer: Managed Care, Other (non HMO) | Source: Ambulatory Visit | Attending: Physician Assistant | Admitting: Physician Assistant

## 2014-09-14 ENCOUNTER — Encounter: Payer: Self-pay | Admitting: Physician Assistant

## 2014-09-14 VITALS — BP 144/77 | HR 78 | Temp 99.0°F | Resp 16 | Ht 67.0 in | Wt 255.1 lb

## 2014-09-14 DIAGNOSIS — W19XXXA Unspecified fall, initial encounter: Secondary | ICD-10-CM | POA: Insufficient documentation

## 2014-09-14 DIAGNOSIS — S93401A Sprain of unspecified ligament of right ankle, initial encounter: Secondary | ICD-10-CM

## 2014-09-14 DIAGNOSIS — S99911A Unspecified injury of right ankle, initial encounter: Secondary | ICD-10-CM | POA: Diagnosis present

## 2014-09-14 NOTE — Patient Instructions (Signed)
Please drop by [facility] or another Medical Supply store and give them the prescription given to you so you can be fitted for an Air Cast to stabilize your ankle.  Continue Aleve for pain.  Ice the ankle and keep elevated at home.  A Compression stocking may also be beneficial. You can get these at the pharmacy as well as supply stores.  Go downstairs for x-ray. I will call you with your results.  If there is a fracture, we will send you to Orthopedic Surgery for casting.

## 2014-09-14 NOTE — Progress Notes (Signed)
Pre visit review using our clinic review tool, if applicable. No additional management support is needed unless otherwise documented below in the visit note/SLS  

## 2014-09-16 DIAGNOSIS — S93409A Sprain of unspecified ligament of unspecified ankle, initial encounter: Secondary | ICD-10-CM | POA: Insufficient documentation

## 2014-09-16 NOTE — Assessment & Plan Note (Signed)
X-ray of Right foot and ankle obtained without evidence of fracture.  Rx Air Cast to provide stability to ankle.  Wear x 2 weeks.  RICE therapy discussed.  Supportive foot wear a must.  Patient refuses pain medication.  Follow-up in 2 weeks.

## 2014-09-16 NOTE — Progress Notes (Signed)
Patient presents to clinic today c/o R ankle pain and swelling x 1 week after twisting her ankle when walking down the steps of her porch.  Endorses swelling and mild bruising.  Has history of ankle sprain of R ankle in 20s.  Endorses ROM although with pain.  Denies pallor or coldness of extremity.  Past Medical History  Diagnosis Date  . Hypertension   . Dyslipidemia   . Colon polyps   . Anemia     EGD and Cscope 2008; Novasure 2010 for excessive vag bleed  . Migraines   . Depression   . Breast cancer 05/21/2011  . Contraception     husband's vasectomy    Current Outpatient Prescriptions on File Prior to Visit  Medication Sig Dispense Refill  . carvedilol (COREG) 25 MG tablet Take 1 tablet (25 mg total) by mouth 2 (two) times daily with a meal.  180 tablet  1  . cloNIDine (CATAPRES) 0.1 MG tablet Take 1 tablet (0.1 mg total) by mouth 2 (two) times daily.  180 tablet  1  . felodipine (PLENDIL) 5 MG 24 hr tablet Take 1 tablet (5 mg total) by mouth daily.  90 tablet  1  . omeprazole (PRILOSEC) 20 MG capsule Take 20 mg by mouth daily.        . simvastatin (ZOCOR) 40 MG tablet take 1 tablet by mouth every evening  30 tablet  1  . tamoxifen (NOLVADEX) 20 MG tablet Take 20 mg by mouth daily.        . TOVIAZ 4 MG TB24 tablet Take 4 mg by mouth daily.       No current facility-administered medications on file prior to visit.    No Known Allergies  Family History  Problem Relation Age of Onset  . Coronary artery disease Father     several MIs, CABG  . Hypertension Father   . Diabetes Father   . Parkinsonism Father   . Diabetes Sister   . Stroke      GM  . Colon cancer Paternal Grandfather   . Stroke Sister   . Breast cancer      G aunt    History   Social History  . Marital Status: Married    Spouse Name: N/A    Number of Children: 1  . Years of Education: N/A   Occupational History  . not working at present    .     Social History Main Topics  . Smoking status:  Current Every Day Smoker -- 2.00 packs/day  . Smokeless tobacco: Never Used     Comment: 2 PPD  . Alcohol Use: No  . Drug Use: No  . Sexual Activity: Yes    Birth Control/ Protection: Surgical     Comment: husband had vasectomy    Other Topics Concern  . None   Social History Narrative   birth control, husband had a vasectomy         Review of Systems - See HPI.  All other ROS are negative.  BP 144/77  Pulse 78  Temp(Src) 99 F (37.2 C) (Oral)  Resp 16  Ht 5\' 7"  (1.702 m)  Wt 255 lb 2 oz (115.724 kg)  BMI 39.95 kg/m2  SpO2 98%  Physical Exam  Vitals reviewed. Constitutional: She is oriented to person, place, and time and well-developed, well-nourished, and in no distress.  HENT:  Head: Normocephalic and atraumatic.  Cardiovascular: Normal rate, regular rhythm, normal heart sounds and intact distal  pulses.   Pulmonary/Chest: Effort normal and breath sounds normal. No respiratory distress. She has no wheezes. She has no rales. She exhibits no tenderness.  Musculoskeletal:       Right knee: Normal.       Right ankle: She exhibits swelling. She exhibits normal range of motion, no deformity and normal pulse. Tenderness. Lateral malleolus and AITFL tenderness found. No medial malleolus tenderness found. Achilles tendon normal.  Neurological: She is alert and oriented to person, place, and time. She has normal reflexes.  Skin: Skin is warm and dry. No rash noted.  Psychiatric: Affect normal.   Assessment/Plan: Ankle sprain X-ray of Right foot and ankle obtained without evidence of fracture.  Rx Air Cast to provide stability to ankle.  Wear x 2 weeks.  RICE therapy discussed.  Supportive foot wear a must.  Patient refuses pain medication.  Follow-up in 2 weeks.

## 2014-09-30 ENCOUNTER — Encounter: Payer: Self-pay | Admitting: Physician Assistant

## 2014-09-30 ENCOUNTER — Ambulatory Visit (HOSPITAL_BASED_OUTPATIENT_CLINIC_OR_DEPARTMENT_OTHER)
Admission: RE | Admit: 2014-09-30 | Discharge: 2014-09-30 | Disposition: A | Discharge status: Home / Self Care | Payer: Managed Care, Other (non HMO) | Source: Ambulatory Visit | Attending: Physician Assistant | Admitting: Physician Assistant

## 2014-09-30 ENCOUNTER — Ambulatory Visit (INDEPENDENT_AMBULATORY_CARE_PROVIDER_SITE_OTHER): Payer: Managed Care, Other (non HMO) | Admitting: Physician Assistant

## 2014-09-30 VITALS — BP 108/70 | HR 81 | Temp 97.5°F | Resp 16 | Ht 67.0 in | Wt 253.4 lb

## 2014-09-30 DIAGNOSIS — M7989 Other specified soft tissue disorders: Secondary | ICD-10-CM | POA: Diagnosis present

## 2014-09-30 DIAGNOSIS — I82491 Acute embolism and thrombosis of other specified deep vein of right lower extremity: Secondary | ICD-10-CM | POA: Diagnosis not present

## 2014-09-30 DIAGNOSIS — M79604 Pain in right leg: Secondary | ICD-10-CM | POA: Diagnosis present

## 2014-09-30 NOTE — Assessment & Plan Note (Addendum)
Patient with recent ankle sprain, however edema is now extended to shin and calf.  Concern for DVT as swelling is one-sided. Will obtain STAT Doppler US today. Rx Compression stockings. If clot present, will treat appropriately.  If negative, will Rx Furosemide 20 mg tablet to take daily.  Follow-up will be based on results.  Alarm signs/symptoms reviewed with patient.  Patient is aware of when to call 911 if indicated.

## 2014-09-30 NOTE — Progress Notes (Signed)
Pre visit review using our clinic review tool, if applicable. No additional management support is needed unless otherwise documented below in the visit note/SLS  

## 2014-09-30 NOTE — Patient Instructions (Signed)
Please return to the Imaging department around 2:00 to get checked in for your ultrasound.  We are trying to rule out a clot in your leg as a cause of the swelling.  They will call me with your results before they let you leave.  If a clot is present, they will send you back upstairs for medication.  If there is no clot, I will send in a medication to help resolve the swelling.  Continue conservative measures at home.  Begin wearing compression stockings during the day.  Follow-up will be based on results

## 2014-09-30 NOTE — Progress Notes (Signed)
Patient presents to clinic today c/o continued swelling of RLE s/p ankle sprain. Patient endorses resolution of pain and decreased ROM.  Swelling has now extended into R calf.  Patient denies chest pain, shortness of breath, recent surgery, prolonged immobilization or long-distance travel.  Denies history of DVT or PE.  X-ray at last visit unremarkable for fracture.  Denies swelling of LLE.  Past Medical History  Diagnosis Date  . Hypertension   . Dyslipidemia   . Colon polyps   . Anemia     EGD and Cscope 2008; Novasure 2010 for excessive vag bleed  . Migraines   . Depression   . Breast cancer 05/21/2011  . Contraception     husband's vasectomy    Current Outpatient Prescriptions on File Prior to Visit  Medication Sig Dispense Refill  . carvedilol (COREG) 25 MG tablet Take 1 tablet (25 mg total) by mouth 2 (two) times daily with a meal. 180 tablet 1  . cloNIDine (CATAPRES) 0.1 MG tablet Take 1 tablet (0.1 mg total) by mouth 2 (two) times daily. 180 tablet 1  . felodipine (PLENDIL) 5 MG 24 hr tablet Take 1 tablet (5 mg total) by mouth daily. 90 tablet 1  . omeprazole (PRILOSEC) 20 MG capsule Take 20 mg by mouth daily.      . simvastatin (ZOCOR) 40 MG tablet take 1 tablet by mouth every evening 30 tablet 1  . tamoxifen (NOLVADEX) 20 MG tablet Take 20 mg by mouth daily.      . TOVIAZ 4 MG TB24 tablet Take 4 mg by mouth daily.     No current facility-administered medications on file prior to visit.    No Known Allergies  Family History  Problem Relation Age of Onset  . Coronary artery disease Father     several MIs, CABG  . Hypertension Father   . Diabetes Father   . Parkinsonism Father   . Diabetes Sister   . Stroke      GM  . Colon cancer Paternal Grandfather   . Stroke Sister   . Breast cancer      G aunt    History   Social History  . Marital Status: Married    Spouse Name: N/A    Number of Children: 1  . Years of Education: N/A   Occupational History  .  not working at present    .     Social History Main Topics  . Smoking status: Current Every Day Smoker -- 2.00 packs/day  . Smokeless tobacco: Never Used     Comment: 2 PPD  . Alcohol Use: No  . Drug Use: No  . Sexual Activity: Yes    Birth Control/ Protection: Surgical     Comment: husband had vasectomy    Other Topics Concern  . None   Social History Narrative   birth control, husband had a vasectomy         Review of Systems - See HPI.  All other ROS are negative.  BP 108/70 mmHg  Pulse 81  Temp(Src) 97.5 F (36.4 C) (Oral)  Resp 16  Ht 5\' 7"  (1.702 m)  Wt 253 lb 6 oz (114.93 kg)  BMI 39.67 kg/m2  SpO2 98%  Physical Exam  Constitutional: She is oriented to person, place, and time and well-developed, well-nourished, and in no distress.  HENT:  Head: Normocephalic and atraumatic.  Eyes: Conjunctivae are normal.  Cardiovascular: Normal rate, regular rhythm, normal heart sounds and intact distal pulses.  Pulmonary/Chest: Effort normal and breath sounds normal. No respiratory distress. She has no wheezes. She has no rales. She exhibits no tenderness.  Musculoskeletal:       Right lower leg: She exhibits tenderness, swelling and edema. She exhibits no bony tenderness.  Negative Homan sign.  ROM intact.  Neurological: She is alert and oriented to person, place, and time.  Skin: Skin is warm and dry. No rash noted.  Psychiatric: Affect normal.  Vitals reviewed.  Assessment/Plan: Leg swelling Patient with recent ankle sprain, however edema is now extended to shin and calf.  Concern for DVT as swelling is one-sided. Will obtain STAT Doppler US today. Rx Compression stockings. If clot present, will treat appropriately.  If negative, will Rx Furosemide 20 mg tablet to take daily.  Follow-up will be based on results.  Alarm signs/symptoms reviewed with patient.  Patient is aware of when to call 911 if indicated.

## 2014-10-17 ENCOUNTER — Encounter: Payer: Self-pay | Admitting: Physician Assistant

## 2014-10-17 ENCOUNTER — Ambulatory Visit (INDEPENDENT_AMBULATORY_CARE_PROVIDER_SITE_OTHER): Payer: Managed Care, Other (non HMO) | Admitting: Physician Assistant

## 2014-10-17 VITALS — BP 120/86 | HR 82 | Temp 97.9°F | Resp 16 | Ht 67.0 in | Wt 253.1 lb

## 2014-10-17 DIAGNOSIS — S99911S Unspecified injury of right ankle, sequela: Secondary | ICD-10-CM

## 2014-10-17 DIAGNOSIS — S99919A Unspecified injury of unspecified ankle, initial encounter: Secondary | ICD-10-CM | POA: Insufficient documentation

## 2014-10-17 DIAGNOSIS — I82401 Acute embolism and thrombosis of unspecified deep veins of right lower extremity: Secondary | ICD-10-CM

## 2014-10-17 DIAGNOSIS — I82409 Acute embolism and thrombosis of unspecified deep veins of unspecified lower extremity: Secondary | ICD-10-CM | POA: Insufficient documentation

## 2014-10-17 MED ORDER — RIVAROXABAN 20 MG PO TABS: 20.0000 mg | ORAL_TABLET | Freq: Every day | ORAL | Status: DC

## 2014-10-17 NOTE — Assessment & Plan Note (Signed)
Has almost finished 21 days of 15 mg BID Xarelto.  Patient given 73-month supply of Xarelto 20 mg to take once daily once first dosing is complete.  Voucher and Rx given for additional month.  Reiterated supportive measures and alarm signs/symptoms to patient.  Follow-up in 1 month.

## 2014-10-17 NOTE — Progress Notes (Signed)
Pre visit review using our clinic review tool, if applicable. No additional management support is needed unless otherwise documented below in the visit note/SLS  

## 2014-10-17 NOTE — Patient Instructions (Signed)
Once you have finished the 15 mg tablets of Xarelto, begin taking the 20 mg tablet by mouth daily.  Continue ambulating as tolerated. Again if you notice any chest pain or shortness of breath, please call 911 as this coulf indicate the clot has moved to your lungs.  We will complete a total of 3 months of medication for the clot.  You will be contacted by Podiatry for evaluation of your right ankle as all imaging obtained is negative for cause of your symptoms.  Follow-up in 1 month.

## 2014-10-17 NOTE — Assessment & Plan Note (Signed)
Residual symptoms, despite negative x-rays and conservative measures.  Referral placed to Podiatry per patient's request.

## 2014-10-17 NOTE — Progress Notes (Signed)
Patient presents to clinic today for follow-up of DVT of RLE.  Patient placed on Xarelto 15 mg BID x 3 days.  Is taking medication as directed.  Endorses improvement in leg pain and swelling.  Denies chest pain or shortness of breath.  Still having some residual pain and swelling around right lateral ankle.  Denies decreased ROM, repeat trauma or injury.  Is requesting referral to Podiatry.   Past Medical History  Diagnosis Date  . Hypertension   . Dyslipidemia   . Colon polyps   . Anemia     EGD and Cscope 2008; Novasure 2010 for excessive vag bleed  . Migraines   . Depression   . Breast cancer 05/21/2011  . Contraception     husband's vasectomy    Current Outpatient Prescriptions on File Prior to Visit  Medication Sig Dispense Refill  . carvedilol (COREG) 25 MG tablet Take 1 tablet (25 mg total) by mouth 2 (two) times daily with a meal. 180 tablet 1  . cloNIDine (CATAPRES) 0.1 MG tablet Take 1 tablet (0.1 mg total) by mouth 2 (two) times daily. 180 tablet 1  . felodipine (PLENDIL) 5 MG 24 hr tablet Take 1 tablet (5 mg total) by mouth daily. 90 tablet 1  . omeprazole (PRILOSEC) 20 MG capsule Take 20 mg by mouth daily.      . simvastatin (ZOCOR) 40 MG tablet take 1 tablet by mouth every evening 30 tablet 1  . tamoxifen (NOLVADEX) 20 MG tablet Take 20 mg by mouth daily.      . TOVIAZ 4 MG TB24 tablet Take 4 mg by mouth daily.     No current facility-administered medications on file prior to visit.    No Known Allergies  Family History  Problem Relation Age of Onset  . Coronary artery disease Father     several MIs, CABG  . Hypertension Father   . Diabetes Father   . Parkinsonism Father   . Diabetes Sister   . Stroke      GM  . Colon cancer Paternal Grandfather   . Stroke Sister   . Breast cancer      G aunt    History   Social History  . Marital Status: Married    Spouse Name: N/A    Number of Children: 1  . Years of Education: N/A   Occupational History  .  not working at present    .     Social History Main Topics  . Smoking status: Current Every Day Smoker -- 2.00 packs/day  . Smokeless tobacco: Never Used     Comment: 2 PPD  . Alcohol Use: No  . Drug Use: No  . Sexual Activity: Yes    Birth Control/ Protection: Surgical     Comment: husband had vasectomy    Other Topics Concern  . None   Social History Narrative   birth control, husband had a vasectomy         Review of Systems - See HPI.  All other ROS are negative.  BP 120/86 mmHg  Pulse 82  Temp(Src) 97.9 F (36.6 C) (Oral)  Resp 16  Ht 5\' 7"  (1.702 m)  Wt 253 lb 2 oz (114.817 kg)  BMI 39.64 kg/m2  SpO2 95%  Physical Exam  Constitutional: She is oriented to person, place, and time and well-developed, well-nourished, and in no distress.  HENT:  Head: Normocephalic and atraumatic.  Eyes: Conjunctivae are normal.  Cardiovascular: Normal rate, regular rhythm,  normal heart sounds and intact distal pulses.   Peripheral pulses are 2+ and equal.  Pulmonary/Chest: Effort normal and breath sounds normal. No respiratory distress. She has no wheezes. She has no rales. She exhibits no tenderness.  Musculoskeletal:       Right ankle: She exhibits swelling. Tenderness. Lateral malleolus tenderness found.       Right lower leg: She exhibits no tenderness, no swelling and no edema.  Neurological: She is alert and oriented to person, place, and time.  Skin: Skin is warm and dry. No rash noted.  Psychiatric: Affect normal.  Vitals reviewed.  Assessment/Plan: Acute DVT (deep venous thrombosis) Has almost finished 21 days of 15 mg BID Xarelto.  Patient given 17-month supply of Xarelto 20 mg to take once daily once first dosing is complete.  Voucher and Rx given for additional month.  Reiterated supportive measures and alarm signs/symptoms to patient.  Follow-up in 1 month.  Ankle injury Residual symptoms, despite negative x-rays and conservative measures.  Referral placed to  Podiatry per patient's request.

## 2014-10-18 ENCOUNTER — Telehealth: Payer: Self-pay | Admitting: Internal Medicine

## 2014-10-18 ENCOUNTER — Ambulatory Visit: Payer: Self-pay | Admitting: Podiatry

## 2014-10-18 NOTE — Telephone Encounter (Signed)
emmi emailed °

## 2014-10-31 ENCOUNTER — Encounter: Payer: Self-pay | Admitting: Podiatry

## 2014-10-31 ENCOUNTER — Ambulatory Visit (INDEPENDENT_AMBULATORY_CARE_PROVIDER_SITE_OTHER): Payer: Managed Care, Other (non HMO) | Admitting: Podiatry

## 2014-10-31 ENCOUNTER — Telehealth: Payer: Self-pay | Admitting: Internal Medicine

## 2014-10-31 ENCOUNTER — Other Ambulatory Visit: Payer: Self-pay

## 2014-10-31 VITALS — BP 135/80 | HR 83 | Ht 67.0 in | Wt 246.0 lb

## 2014-10-31 DIAGNOSIS — S99911A Unspecified injury of right ankle, initial encounter: Secondary | ICD-10-CM

## 2014-10-31 DIAGNOSIS — I10 Essential (primary) hypertension: Secondary | ICD-10-CM

## 2014-10-31 DIAGNOSIS — M659 Synovitis and tenosynovitis, unspecified: Secondary | ICD-10-CM | POA: Insufficient documentation

## 2014-10-31 DIAGNOSIS — M25579 Pain in unspecified ankle and joints of unspecified foot: Secondary | ICD-10-CM | POA: Insufficient documentation

## 2014-10-31 DIAGNOSIS — M6588 Other synovitis and tenosynovitis, other site: Secondary | ICD-10-CM

## 2014-10-31 DIAGNOSIS — S93401A Sprain of unspecified ligament of right ankle, initial encounter: Secondary | ICD-10-CM

## 2014-10-31 MED ORDER — FELODIPINE ER 5 MG PO TB24: 5.0000 mg | ORAL_TABLET | Freq: Every day | ORAL | Status: DC

## 2014-10-31 MED ORDER — CLONIDINE HCL 0.1 MG PO TABS: 0.1000 mg | ORAL_TABLET | Freq: Two times a day (BID) | ORAL | Status: DC

## 2014-10-31 MED ORDER — CARVEDILOL 25 MG PO TABS: 25.0000 mg | ORAL_TABLET | Freq: Two times a day (BID) | ORAL | Status: DC

## 2014-10-31 NOTE — Progress Notes (Signed)
Subjective: 53 year old female presents stating that ankle hurts when twist certain way with sharp pain. Achilles tendon hurts when off and on all day. Old right ankle injury (09/06/14), able to walk on but not getting better. She was treated at [facility] and found to have no fracture but had a small blood clot in Ultra sound exam. This is being treated with oral medication since then (Xarelto).   Review of Systems - General ROS: negative for - chills, fever or night sweats Ophthalmic ROS: negative ENT ROS: negative Allergy and Immunology ROS: negative Hematological and Lymphatic ROS: Being treated for blood clot. Breast ROS: negative for breast lumps Respiratory ROS: no cough, shortness of breath, or wheezing Cardiovascular ROS: no chest pain or dyspnea on exertion Gastrointestinal ROS: no abdominal pain, change in bowel habits, or black or bloody stools Genito-Urinary ROS: no dysuria, trouble voiding, or hematuria Musculoskeletal ROS: negative Neurological ROS: negative Dermatological ROS: negative.   Objective: Dermatologic: No abnormal findings. Vascular: All pedal pulses are palpable. Positive for enlarged right ankle lateral aspect.  Neurologic: Normal findings. All epicritic and tactile sensations grossly intact.  Orthopedic: Cavus type foot. Normal range ankle dorsiflexion without pain bilateral. Mild pain with ankle inversion right. Inducible pain with tip toe weight bearing right foot.  Radiographic examination reveal small area of bone resorption at periosteal surface of the posterior Talar neck area. No gross changes noted.   Assessment: Tenosynovitis posterolateral ankle right foot following injury (09/06/14).   Plan: Reviewed findings and available treatment options. Ankle braced placed in right foot. Use the brace for a month and change to elastic support if needed.

## 2014-10-31 NOTE — Telephone Encounter (Signed)
Caller name: Shaylah, Mcghie Relation to pt: self  Call back number: [phone] Pharmacy: [email or web address] - [place] [address] [phone]  Reason for call:  Pt requesting a refill the following medication  carvedilol (COREG) 25 MG tablet  cloNIDine (CATAPRES) 0.1 MG table felodipine (PLENDIL) 5 MG 24 hr tablet

## 2014-10-31 NOTE — Patient Instructions (Signed)
Seen for right ankle pain. May benefit from ankle brace. Return as needed.

## 2014-10-31 NOTE — Telephone Encounter (Signed)
Medications refilled to [facility] as requested.

## 2014-11-03 ENCOUNTER — Other Ambulatory Visit: Payer: Self-pay

## 2014-11-18 DIAGNOSIS — K219 Gastro-esophageal reflux disease without esophagitis: Secondary | ICD-10-CM

## 2014-11-18 HISTORY — DX: Gastro-esophageal reflux disease without esophagitis: K21.9

## 2014-12-01 ENCOUNTER — Ambulatory Visit (INDEPENDENT_AMBULATORY_CARE_PROVIDER_SITE_OTHER): Payer: Managed Care, Other (non HMO) | Admitting: Internal Medicine

## 2014-12-01 ENCOUNTER — Encounter: Payer: Self-pay | Admitting: Internal Medicine

## 2014-12-01 VITALS — BP 124/84 | HR 80 | Temp 97.6°F | Ht 67.0 in | Wt 259.5 lb

## 2014-12-01 DIAGNOSIS — S93401D Sprain of unspecified ligament of right ankle, subsequent encounter: Secondary | ICD-10-CM

## 2014-12-01 DIAGNOSIS — E785 Hyperlipidemia, unspecified: Secondary | ICD-10-CM

## 2014-12-01 DIAGNOSIS — E119 Type 2 diabetes mellitus without complications: Secondary | ICD-10-CM

## 2014-12-01 DIAGNOSIS — I10 Essential (primary) hypertension: Secondary | ICD-10-CM

## 2014-12-01 DIAGNOSIS — I82401 Acute embolism and thrombosis of unspecified deep veins of right lower extremity: Secondary | ICD-10-CM

## 2014-12-01 DIAGNOSIS — F17200 Nicotine dependence, unspecified, uncomplicated: Secondary | ICD-10-CM

## 2014-12-01 LAB — BASIC METABOLIC PANEL
BUN: 12 mg/dL (ref 6–23)
CHLORIDE: 110 meq/L (ref 96–112)
CO2: 24 mEq/L (ref 19–32)
Calcium: 8.6 mg/dL (ref 8.4–10.5)
Creatinine, Ser: 0.88 mg/dL (ref 0.40–1.20)
GFR: 71.91 mL/min (ref >60.00)
Glucose, Bld: 140 mg/dL — ABNORMAL HIGH (ref 70–99)
POTASSIUM: 4.3 meq/L (ref 3.5–5.1)
Sodium: 140 mEq/L (ref 135–145)

## 2014-12-01 LAB — HEMOGLOBIN A1C: Hgb A1c MFr Bld: 7.1 % — ABNORMAL HIGH (ref 4.6–6.5)

## 2014-12-01 MED ORDER — RIVAROXABAN 20 MG PO TABS: 20.0000 mg | ORAL_TABLET | Freq: Every day | ORAL | Status: DC

## 2014-12-01 MED ORDER — OMEPRAZOLE 20 MG PO CPDR: 20.0000 mg | DELAYED_RELEASE_CAPSULE | Freq: Every day | ORAL | Status: DC

## 2014-12-01 MED ORDER — VARENICLINE TARTRATE 1 MG PO TABS: 1.0000 mg | ORAL_TABLET | Freq: Two times a day (BID) | ORAL | Status: DC

## 2014-12-01 MED ORDER — CARVEDILOL 25 MG PO TABS: 25.0000 mg | ORAL_TABLET | Freq: Two times a day (BID) | ORAL | Status: DC

## 2014-12-01 MED ORDER — VARENICLINE TARTRATE 0.5 MG X 11 & 1 MG X 42 PO MISC: ORAL | Status: DC

## 2014-12-01 MED ORDER — FELODIPINE ER 5 MG PO TB24: 5.0000 mg | ORAL_TABLET | Freq: Every day | ORAL | Status: DC

## 2014-12-01 MED ORDER — SIMVASTATIN 40 MG PO TABS: 40.0000 mg | ORAL_TABLET | Freq: Every evening | ORAL | Status: DC

## 2014-12-01 MED ORDER — CLONIDINE HCL 0.1 MG PO TABS: 0.1000 mg | ORAL_TABLET | Freq: Two times a day (BID) | ORAL | Status: DC

## 2014-12-01 NOTE — Assessment & Plan Note (Signed)
Had a sprain of the right ankle ~ 09/14/2014, subsequently was seen 10/01/2015 with swelling, ultrasound show a DVT below the knee. Was a started on Xarelto, subsequently hematology recommended to discontinue tamoxifen which she did. This was a provoked  DVT while taking tamoxifen. Recommend Xarelto until 12/31/2014  Unless hematology suggests something different, she has an upcoming appointment with them

## 2014-12-01 NOTE — Progress Notes (Signed)
Pre visit review using our clinic review tool, if applicable. No additional management support is needed unless otherwise documented below in the visit note. 

## 2014-12-01 NOTE — Assessment & Plan Note (Signed)
Due to check A1c and BMP

## 2014-12-01 NOTE — Assessment & Plan Note (Signed)
Refill simvastatin, encouraged to continue taking it

## 2014-12-01 NOTE — Patient Instructions (Signed)
Get your blood work before you leave   Continue XARELTO until 12/31/2014 (unless  hematology tells you  Different)  Take Chantix as prescribed for up to 3 months.  Please come back to the office by June 2016  for a physical exam. Come back fasting     Smoking Cessation Quitting smoking is important to your health and has many advantages. However, it is not always easy to quit since nicotine is a very addictive drug. Oftentimes, people try 3 times or more before being able to quit. This document explains the best ways for you to prepare to quit smoking. Quitting takes hard work and a lot of effort, but you can do it. ADVANTAGES OF QUITTING SMOKING  You will live longer, feel better, and live better.  Your body will feel the impact of quitting smoking almost immediately.  Within 20 minutes, blood pressure decreases. Your pulse returns to its normal level.  After 8 hours, carbon monoxide levels in the blood return to normal. Your oxygen level increases.  After 24 hours, the chance of having a heart attack starts to decrease. Your breath, hair, and body stop smelling like smoke.  After 48 hours, damaged nerve endings begin to recover. Your sense of taste and smell improve.  After 72 hours, the body is virtually free of nicotine. Your bronchial tubes relax and breathing becomes easier.  After 2 to 12 weeks, lungs can hold more air. Exercise becomes easier and circulation improves.  The risk of having a heart attack, stroke, cancer, or lung disease is greatly reduced.  After 1 year, the risk of coronary heart disease is cut in half.  After 5 years, the risk of stroke falls to the same as a nonsmoker.  After 10 years, the risk of lung cancer is cut in half and the risk of other cancers decreases significantly.  After 15 years, the risk of coronary heart disease drops, usually to the level of a nonsmoker.  If you are pregnant, quitting smoking will improve your chances of having a  healthy baby.  The people you live with, especially any children, will be healthier.  You will have extra money to spend on things other than cigarettes. QUESTIONS TO THINK ABOUT BEFORE ATTEMPTING TO QUIT You may want to talk about your answers with your health care provider.  Why do you want to quit?  If you tried to quit in the past, what helped and what did not?  What will be the most difficult situations for you after you quit? How will you plan to handle them?  Who can help you through the tough times? Your family? Friends? A health care provider?  What pleasures do you get from smoking? What ways can you still get pleasure if you quit? Here are some questions to ask your health care provider:  How can you help me to be successful at quitting?  What medicine do you think would be best for me and how should I take it?  What should I do if I need more help?  What is smoking withdrawal like? How can I get information on withdrawal? GET READY  Set a quit date.  Change your environment by getting rid of all cigarettes, ashtrays, matches, and lighters in your home, car, or work. Do not let people smoke in your home.  Review your past attempts to quit. Think about what worked and what did not. GET SUPPORT AND ENCOURAGEMENT You have a better chance of being successful if you  have help. You can get support in many ways.  Tell your family, friends, and coworkers that you are going to quit and need their support. Ask them not to smoke around you.  Get individual, group, or telephone counseling and support. Programs are available at [employer] and health centers. Call your local health department for information about programs in your area.  Spiritual beliefs and practices may help some smokers quit.  Download a "quit meter" on your computer to keep track of quit statistics, such as how long you have gone without smoking, cigarettes not smoked, and money saved.  Get a  self-help book about quitting smoking and staying off tobacco. [place] yourself from urges to smoke. Talk to someone, go for a walk, or occupy your time with a task.  Change your normal routine. Take a different route to work. Drink tea instead of coffee. Eat breakfast in a different place.  Reduce your stress. Take a hot bath, exercise, or read a book.  Plan something enjoyable to do every day. Reward yourself for not smoking.  Explore interactive web-based programs that specialize in helping you quit. GET MEDICINE AND USE IT CORRECTLY Medicines can help you stop smoking and decrease the urge to smoke. Combining medicine with the above behavioral methods and support can greatly increase your chances of successfully quitting smoking.  Nicotine replacement therapy helps deliver nicotine to your body without the negative effects and risks of smoking. Nicotine replacement therapy includes nicotine gum, lozenges, inhalers, nasal sprays, and skin patches. Some may be available over-the-counter and others require a prescription.  Antidepressant medicine helps people abstain from smoking, but how this works is unknown. This medicine is available by prescription.  Nicotinic receptor partial agonist medicine simulates the effect of nicotine in your brain. This medicine is available by prescription. Ask your health care provider for advice about which medicines to use and how to use them based on your health history. Your health care provider will tell you what side effects to look out for if you choose to be on a medicine or therapy. Carefully read the information on the package. Do not use any other product containing nicotine while using a nicotine replacement product.  RELAPSE OR DIFFICULT SITUATIONS Most relapses occur within the first 3 months after quitting. Do not be discouraged if you start smoking again. Remember, most people try several times before finally  quitting. You may have symptoms of withdrawal because your body is used to nicotine. You may crave cigarettes, be irritable, feel very hungry, cough often, get headaches, or have difficulty concentrating. The withdrawal symptoms are only temporary. They are strongest when you first quit, but they will go away within 10-14 days. To reduce the chances of relapse, try to:  Avoid drinking alcohol. Drinking lowers your chances of successfully quitting.  Reduce the amount of caffeine you consume. Once you quit smoking, the amount of caffeine in your body increases and can give you symptoms, such as a rapid heartbeat, sweating, and anxiety.  Avoid smokers because they can make you want to smoke.  Do not let weight gain distract you. Many smokers will gain weight when they quit, usually less than 10 pounds. Eat a healthy diet and stay active. You can always lose the weight gained after you quit.  Find ways to improve your mood other than smoking. FOR MORE INFORMATION  www.smokefree.gov  Document Released: 10/29/2001 Document Revised: 03/21/2014 Document Reviewed: 02/13/2012 [facility] Patient Information 2015 [employer]. This information is not intended to replace advice given to you by your health care provider. Make sure you discuss any questions you have with your health care provider.  

## 2014-12-01 NOTE — Progress Notes (Signed)
Subjective:    Patient ID: Wanda Ortega, female    DOB: [birth year], 54 y.o.   MRN: [id number]  DOS:  12/01/2014 Type of visit - description : Routine visit Interval history: Since the last time I saw her, she developed a DVT, now on Xarelto, see assessment and plan High cholesterol, needs a refill on simvastatin, wonders if is a good idea to keep taking it. Diabetes, labs reviewed, due for a A1c Tobacco, "ready to quit", Chantix?.   ROS Denies chest pain, difficulty breathing, palpitations. She had a ankle sprain, still has occasional pain only with certain motions. Still has occasional R lower extremity edema since the sprain, random, on and off, not severe.  Past Medical History  Diagnosis Date  . Hypertension   . Dyslipidemia   . Colon polyps   . Anemia     EGD and Cscope 2008; Novasure 2010 for excessive vag bleed  . Migraines   . Depression   . Breast cancer 05/21/2011  . Contraception     husband's vasectomy    Past Surgical History  Procedure Laterality Date  . Cholecystectomy    . Novasure ablation  01/2009  . Breast lumpectomy Left July 2012     History   Social History  . Marital Status: Married    Spouse Name: N/A    Number of Children: 1  . Years of Education: N/A   Occupational History  . not working at present    .     Social History Main Topics  . Smoking status: Current Every Day Smoker -- 2.00 packs/day  . Smokeless tobacco: Never Used     Comment: 2 PPD  . Alcohol Use: No  . Drug Use: No  . Sexual Activity: Yes    Birth Control/ Protection: Surgical     Comment: husband had vasectomy    Other Topics Concern  . Not on file   Social History Narrative   birth control, husband had a vasectomy              Medication List       This list is accurate as of: 12/01/14 11:59 PM.  Always use your most recent med list.               carvedilol 25 MG tablet  Commonly known as:  COREG  Take 1 tablet (25 mg total) by mouth 2  (two) times daily with a meal.     cloNIDine 0.1 MG tablet  Commonly known as:  CATAPRES  Take 1 tablet (0.1 mg total) by mouth 2 (two) times daily.     felodipine 5 MG 24 hr tablet  Commonly known as:  PLENDIL  Take 1 tablet (5 mg total) by mouth daily.     omeprazole 20 MG capsule  Commonly known as:  PRILOSEC  Take 1 capsule (20 mg total) by mouth daily.     rivaroxaban 20 MG Tabs tablet  Commonly known as:  XARELTO  Take 1 tablet (20 mg total) by mouth daily with supper.     simvastatin 40 MG tablet  Commonly known as:  ZOCOR  Take 1 tablet (40 mg total) by mouth every evening.     TOVIAZ 4 MG Tb24 tablet  Generic drug:  fesoterodine  Take 4 mg by mouth daily.     varenicline 1 MG tablet  Commonly known as:  CHANTIX  Take 1 tablet (1 mg total) by mouth 2 (two) times daily.  varenicline 0.5 MG X 11 & 1 MG X 42 tablet  Commonly known as:  CHANTIX STARTING MONTH PAK  Take one 0.5 mg tablet by mouth once daily for 3 days, then increase to one 0.5 mg tablet twice daily for 4 days, then increase to one 1 mg tablet twice daily.           Objective:   Physical Exam BP 124/84 mmHg  Pulse 80  Temp(Src) 97.6 F (36.4 C) (Oral)  Ht 5\' 7"  (1.702 m)  Wt 259 lb 8 oz (117.708 kg)  BMI 40.63 kg/m2  SpO2 97% General -- alert, well-developed, NAD.  Lungs -- normal respiratory effort, no intercostal retractions, no accessory muscle use, and normal breath sounds.  Heart-- normal rate, regular rhythm, no murmur.  Extremities--  Trace pretibial edema bilaterally  Neurologic--  alert & oriented X3. Speech normal, gait appropriate for age, strength symmetric and appropriate for age.  Psych-- Cognition and judgment appear intact. Cooperative with normal attention span and concentration. No anxious or depressed appearing.     Assessment & Plan:

## 2014-12-01 NOTE — Assessment & Plan Note (Signed)
Improving, has occasional edema, states that compression stockings are expensive, recommend to use Ace wraps to prevent chronic swelling

## 2014-12-01 NOTE — Assessment & Plan Note (Signed)
Still smoking a pack a day but states is ready to quit. Wonders about Chantix, I think is a good idea, how to take it and side effects discussed. Prescription provided.

## 2014-12-03 ENCOUNTER — Encounter: Payer: Self-pay | Admitting: Internal Medicine

## 2014-12-20 ENCOUNTER — Ambulatory Visit: Payer: Managed Care, Other (non HMO) | Admitting: Internal Medicine

## 2015-02-09 ENCOUNTER — Telehealth: Payer: Self-pay | Admitting: Internal Medicine

## 2015-02-09 DIAGNOSIS — I10 Essential (primary) hypertension: Secondary | ICD-10-CM

## 2015-02-09 MED ORDER — CLONIDINE HCL 0.1 MG PO TABS: 0.1000 mg | ORAL_TABLET | Freq: Two times a day (BID) | ORAL | Status: DC

## 2015-02-09 MED ORDER — CARVEDILOL 25 MG PO TABS: 25.0000 mg | ORAL_TABLET | Freq: Two times a day (BID) | ORAL | Status: DC

## 2015-02-09 MED ORDER — FELODIPINE ER 5 MG PO TB24: 5.0000 mg | ORAL_TABLET | Freq: Every day | ORAL | Status: DC

## 2015-02-09 NOTE — Telephone Encounter (Signed)
Caller name: Yuleni, Burich Relation to pt: self  Call back number: [phone] Pharmacy: [place], [place] [phone] (Phone) [phone] (Fax)         Reason for call:  Pt requesting a 30 day supply of cloNIDine (CATAPRES) 0.1 MG tablet, carvedilol (COREG) 25 MG tablet and felodipine (PLENDIL) 5 MG 24 hr tablet to hold her over until mail order is received.

## 2015-02-09 NOTE — Telephone Encounter (Signed)
30 day supply of Clonidine, Carvedilol, and Felodipine sent to [facility] as requested.

## 2015-03-16 ENCOUNTER — Other Ambulatory Visit: Payer: Self-pay

## 2015-03-16 ENCOUNTER — Telehealth: Payer: Self-pay | Admitting: Internal Medicine

## 2015-03-16 DIAGNOSIS — I82401 Acute embolism and thrombosis of unspecified deep veins of right lower extremity: Secondary | ICD-10-CM

## 2015-03-16 MED ORDER — RIVAROXABAN 20 MG PO TABS: 20.0000 mg | ORAL_TABLET | Freq: Every day | ORAL | Status: DC

## 2015-03-16 NOTE — Telephone Encounter (Signed)
Relation to pt: self  Call back number: [facility] [place], [place] [phone] (Phone) [phone] (Fax)         Reason for call:  Pt requesting a 1 month supply refill rivaroxaban please send to retail

## 2015-03-16 NOTE — Telephone Encounter (Signed)
Rx sent to [facility] pharmacy

## 2015-04-19 LAB — HM DEXA SCAN: HM Dexa Scan: NORMAL

## 2015-05-01 ENCOUNTER — Telehealth: Payer: Self-pay | Admitting: *Deleted

## 2015-05-01 NOTE — Telephone Encounter (Signed)
Unable to reach patient at time of Pre-Visit Call.  Left message for patient to return call when available.    

## 2015-05-03 ENCOUNTER — Encounter: Payer: Managed Care, Other (non HMO) | Admitting: Internal Medicine

## 2015-05-15 ENCOUNTER — Other Ambulatory Visit: Payer: Self-pay

## 2015-06-19 LAB — HM MAMMOGRAPHY: HM Mammogram: NORMAL

## 2015-06-30 ENCOUNTER — Encounter: Payer: Self-pay | Admitting: Internal Medicine

## 2015-06-30 ENCOUNTER — Ambulatory Visit (INDEPENDENT_AMBULATORY_CARE_PROVIDER_SITE_OTHER): Payer: Managed Care, Other (non HMO) | Admitting: Internal Medicine

## 2015-06-30 VITALS — BP 118/84 | HR 85 | Temp 98.2°F | Ht 67.0 in | Wt 248.1 lb

## 2015-06-30 DIAGNOSIS — Z720 Tobacco use: Secondary | ICD-10-CM

## 2015-06-30 DIAGNOSIS — I82401 Acute embolism and thrombosis of unspecified deep veins of right lower extremity: Secondary | ICD-10-CM | POA: Diagnosis not present

## 2015-06-30 DIAGNOSIS — E119 Type 2 diabetes mellitus without complications: Secondary | ICD-10-CM | POA: Diagnosis not present

## 2015-06-30 DIAGNOSIS — I10 Essential (primary) hypertension: Secondary | ICD-10-CM

## 2015-06-30 DIAGNOSIS — Z114 Encounter for screening for human immunodeficiency virus [HIV]: Secondary | ICD-10-CM

## 2015-06-30 DIAGNOSIS — K219 Gastro-esophageal reflux disease without esophagitis: Secondary | ICD-10-CM | POA: Insufficient documentation

## 2015-06-30 DIAGNOSIS — K2100 Gastro-esophageal reflux disease with esophagitis, without bleeding: Secondary | ICD-10-CM

## 2015-06-30 DIAGNOSIS — R1310 Dysphagia, unspecified: Secondary | ICD-10-CM

## 2015-06-30 DIAGNOSIS — Z1159 Encounter for screening for other viral diseases: Secondary | ICD-10-CM

## 2015-06-30 DIAGNOSIS — F17200 Nicotine dependence, unspecified, uncomplicated: Secondary | ICD-10-CM

## 2015-06-30 LAB — BASIC METABOLIC PANEL
BUN: 11 mg/dL (ref 6–23)
CHLORIDE: 104 meq/L (ref 96–112)
CO2: 27 mEq/L (ref 19–32)
Calcium: 9.5 mg/dL (ref 8.4–10.5)
Creatinine, Ser: 0.82 mg/dL (ref 0.40–1.20)
GFR: 77.84 mL/min (ref >60.00)
GLUCOSE: 151 mg/dL — AB (ref 70–99)
POTASSIUM: 4.5 meq/L (ref 3.5–5.1)
Sodium: 138 mEq/L (ref 135–145)

## 2015-06-30 LAB — CBC WITH DIFFERENTIAL/PLATELET
BASOS ABS: 0 10*3/uL (ref 0.0–0.1)
Basophils Relative: 0.4 % (ref 0.0–3.0)
Eosinophils Absolute: 0.4 10*3/uL (ref 0.0–0.7)
Eosinophils Relative: 3.9 % (ref 0.0–5.0)
HEMATOCRIT: 41.9 % (ref 36.0–46.0)
Hemoglobin: 13.5 g/dL (ref 12.0–15.0)
Lymphocytes Relative: 38.9 % (ref 12.0–46.0)
Lymphs Abs: 3.6 10*3/uL (ref 0.7–4.0)
MCHC: 32.1 g/dL (ref 30.0–36.0)
MCV: 82.3 fl (ref 78.0–100.0)
MONO ABS: 0.5 10*3/uL (ref 0.1–1.0)
Monocytes Relative: 5.7 % (ref 3.0–12.0)
NEUTROS ABS: 4.7 10*3/uL (ref 1.4–7.7)
Neutrophils Relative %: 51.1 % (ref 43.0–77.0)
PLATELETS: 271 10*3/uL (ref 150.0–400.0)
RBC: 5.1 Mil/uL (ref 3.87–5.11)
RDW: 14.8 % (ref 11.5–15.5)
WBC: 9.3 10*3/uL (ref 4.0–10.5)

## 2015-06-30 LAB — HEMOGLOBIN A1C: HEMOGLOBIN A1C: 7.8 % — AB (ref 4.6–6.5)

## 2015-06-30 LAB — MICROALBUMIN / CREATININE URINE RATIO
CREATININE, U: 230.9 mg/dL
MICROALB UR: 4.1 mg/dL — AB (ref 0.0–1.9)
Microalb Creat Ratio: 1.8 mg/g (ref 0.0–30.0)

## 2015-06-30 NOTE — Progress Notes (Signed)
Subjective:    Patient ID: Wanda Ortega, female    DOB: Apr 08, 1961, 54 y.o.   MRN: [id number]  DOS:  06/30/2015 Type of visit - description : Acute visit Interval history: 4 days ago developed sudden difficulty swallowing while eating sausage. Since then is having pain with swallowing, described as a "spasm" in the esophagus, no pain when she's not eating. She has a history of heartburn, good compliance with PPIs, she did have heartburn the first 2 day of her sx  but that is now better. Denies regurgitation.  Since sees here will also take care of chronic medical problems: Hypertension, good compliance of medication. Diabetes: Due for a A1c. No ambulatory CBGs DVT: Saw hematology, not taking anticoagulation.    Review of Systems  No chest pain or difficulty breathing No nausea, vomiting, diarrhea. No blood in the stools. the patient. Denies any cough with food intake   Past Medical History  Diagnosis Date  . Hypertension   . Dyslipidemia   . Colon polyps   . Anemia     EGD and Cscope 2008; Novasure 2010 for excessive vag bleed  . Migraines   . Depression   . Breast cancer 05/21/2011  . Contraception     husband's vasectomy    Past Surgical History  Procedure Laterality Date  . Cholecystectomy    . Novasure ablation  01/2009  . Breast lumpectomy Left July 2012     Social History   Social History  . Marital Status: Married    Spouse Name: N/A  . Number of Children: 1  . Years of Education: N/A   Occupational History  . not working at present    .     Social History Main Topics  . Smoking status: Current Every Day Smoker -- 0.50 packs/day    Types: Cigarettes  . Smokeless tobacco: Never Used     Comment: 2 PPD  . Alcohol Use: No  . Drug Use: No  . Sexual Activity: Yes    Birth Control/ Protection: Surgical     Comment: husband had vasectomy    Other Topics Concern  . Not on file   Social History Narrative   birth control, husband had a  vasectomy              Medication List       This list is accurate as of: 06/30/15 11:59 PM.  Always use your most recent med list.               carvedilol 25 MG tablet  Commonly known as:  COREG  Take 1 tablet (25 mg total) by mouth 2 (two) times daily with a meal.     cloNIDine 0.1 MG tablet  Commonly known as:  CATAPRES  Take 1 tablet (0.1 mg total) by mouth 2 (two) times daily.     felodipine 5 MG 24 hr tablet  Commonly known as:  PLENDIL  Take 1 tablet (5 mg total) by mouth daily.     letrozole 2.5 MG tablet  Commonly known as:  FEMARA  Take 1 tablet by mouth daily.     omeprazole 20 MG capsule  Commonly known as:  PRILOSEC  Take 1 capsule (20 mg total) by mouth daily.     simvastatin 40 MG tablet  Commonly known as:  ZOCOR  Take 1 tablet (40 mg total) by mouth every evening.           Objective:   Physical Exam BP  118/84 mmHg  Pulse 85  Temp(Src) 98.2 F (36.8 C) (Oral)  Ht 5\' 7"  (1.702 m)  Wt 248 lb 2 oz (112.549 kg)  BMI 38.85 kg/m2  SpO2 97% General:   Well developed, well nourished . NAD.  HEENT:  Normocephalic . Face symmetric, atraumatic. Neck without LAD his Lungs:  CTA B Normal respiratory effort, no intercostal retractions, no accessory muscle use. Heart: RRR,  no murmur.  no pretibial edema bilaterally  Abdomen:  Not distended, soft, non-tender. No rebound or rigidity.  Skin: Not pale. Not jaundice Neurologic:  alert & oriented X3.  Speech normal, gait appropriate for age and unassisted Psych--  Cognition and judgment appear intact.  Cooperative with normal attention span and concentration.  Behavior appropriate. No anxious or depressed appearing.    Assessment & Plan:  Screening: HIV, hep C

## 2015-06-30 NOTE — Patient Instructions (Signed)
Get your blood work before you leave   Increase omeprazole to 40 mg every morning before breakfast  We are sending you to our GI doctor, if you have worse let us know.

## 2015-06-30 NOTE — Assessment & Plan Note (Signed)
Was able to get some Chantix through  her oncologist and use it for a while, has decreased significantly tobacco use.

## 2015-06-30 NOTE — Assessment & Plan Note (Signed)
She was somewhat surprised that she has diabetes and not "pre diabetes". A1c reviewed with her, she has diabetes. Labs, diet and exercise discussed

## 2015-06-30 NOTE — Progress Notes (Signed)
Pre visit review using our clinic review tool, if applicable. No additional management support is needed unless otherwise documented below in the visit note. 

## 2015-06-30 NOTE — Assessment & Plan Note (Signed)
BP today is very good, good compliance with medications, check a BMP

## 2015-06-30 NOTE — Assessment & Plan Note (Addendum)
Symptoms were well controlled with omeprazole 20 mg daily until few days ago when she developed dysphagia while eating sausage. Suspect esophagitis from either food or previously poorly-controlled but asymptomatic GERD. Plan: Increase omeprazole to 40 mg daily, refer to GI ----- Dr Gwenyth Bender to the ER if severe symptoms or for regurgitation.

## 2015-07-01 LAB — HEPATITIS C ANTIBODY: HCV Ab: NEGATIVE

## 2015-07-01 LAB — HIV ANTIBODY (ROUTINE TESTING W REFLEX): HIV: NONREACTIVE

## 2015-07-06 MED ORDER — METFORMIN HCL 850 MG PO TABS: 850.0000 mg | ORAL_TABLET | Freq: Two times a day (BID) | ORAL | Status: DC

## 2015-07-06 NOTE — Addendum Note (Signed)
Addended by: Wilfrid Lund on: 07/06/2015 08:05 AM   Modules accepted: Orders

## 2015-07-25 ENCOUNTER — Other Ambulatory Visit: Payer: Self-pay

## 2015-08-08 ENCOUNTER — Encounter: Payer: Self-pay | Admitting: Behavioral Health

## 2015-08-08 ENCOUNTER — Telehealth: Payer: Self-pay | Admitting: Behavioral Health

## 2015-08-08 NOTE — Telephone Encounter (Signed)
Pre-Visit Call completed with patient and chart updated.   Pre-Visit Info documented in Specialty Comments under [employer].    

## 2015-08-08 NOTE — Telephone Encounter (Signed)
Unable to reach patient at time of Pre-Visit Call.  Left message for patient to return call when available.    

## 2015-08-08 NOTE — Addendum Note (Signed)
Addended by: Eduard Roux E on: 08/08/2015 12:21 PM   Modules accepted: Orders, Medications

## 2015-08-09 ENCOUNTER — Encounter: Payer: Self-pay | Admitting: Internal Medicine

## 2015-08-09 ENCOUNTER — Encounter: Payer: Managed Care, Other (non HMO) | Admitting: Internal Medicine

## 2015-08-09 ENCOUNTER — Ambulatory Visit (INDEPENDENT_AMBULATORY_CARE_PROVIDER_SITE_OTHER): Payer: Managed Care, Other (non HMO) | Admitting: Internal Medicine

## 2015-08-09 VITALS — BP 132/84 | HR 90 | Temp 98.4°F | Ht 67.0 in | Wt 249.5 lb

## 2015-08-09 DIAGNOSIS — Z0000 Encounter for general adult medical examination without abnormal findings: Secondary | ICD-10-CM | POA: Diagnosis not present

## 2015-08-09 DIAGNOSIS — Z09 Encounter for follow-up examination after completed treatment for conditions other than malignant neoplasm: Secondary | ICD-10-CM

## 2015-08-09 NOTE — Progress Notes (Signed)
Subjective:    Patient ID: Wanda Ortega, female    DOB: [birth year], 54 y.o.   MRN: [id number]  DOS:  08/09/2015 Type of visit - description : Complete physical exam Interval history: In addition to her physical exam we had a long discussion about diabetes    Review of Systems Constitutional: No fever. No chills. No unexplained wt changes. + Hot flashes  HEENT: No dental problems, no ear discharge, no facial swelling, no voice changes. No eye discharge, no eye  redness , no  intolerance to light   Respiratory: No wheezing , no  difficulty breathing. No cough , no mucus production  Cardiovascular: No CP, occ  leg swelling , no  Palpitations  GI: no nausea, no vomiting, no diarrhea , no  abdominal pain.  No blood in the stools. No dysphagia, no odynophagia    Endocrine: No polyphagia, no polyuria , no polydipsia. + Dry mouth  GU: No dysuria, gross hematuria, difficulty urinating. No urinary urgency, no frequency.  Musculoskeletal: Occasional aches and pains, she thinks due to medications  Skin: No change in the color of the skin, palor , no  Rash  Allergic, immunologic: No environmental allergies , no  food allergies  Neurological: No dizziness no  syncope. No headaches. No diplopia, no slurred, no slurred speech, no motor deficits, no facial  Numbness  Hematological: No enlarged lymph nodes, no easy bruising , no unusual bleedings  Psychiatry: No suicidal ideas, no hallucinations, no beavior problems, no confusion.  No unusual/severe anxiety, no depression   Past Medical History  Diagnosis Date  . Hypertension   . Dyslipidemia   . Colon polyps   . Anemia     EGD and Cscope 2008; Novasure 2010 for excessive vag bleed  . Migraines   . Depression   . Breast cancer 05/21/2011  . Contraception     husband's vasectomy  . GERD (gastroesophageal reflux disease) 2016    Dr. Shana Chute    Past Surgical History  Procedure Laterality Date  . Cholecystectomy    . Novasure  ablation  01/2009  . Breast lumpectomy Left July 2012     Social History   Social History  . Marital Status: Married    Spouse Name: N/A  . Number of Children: 1  . Years of Education: N/A   Occupational History  . not working at present    .     Social History Main Topics  . Smoking status: Current Every Day Smoker -- 0.50 packs/day    Types: Cigarettes  . Smokeless tobacco: Never Used     Comment: 0.5 PPD  . Alcohol Use: No  . Drug Use: No  . Sexual Activity: Yes    Birth Control/ Protection: Surgical     Comment: husband had vasectomy    Other Topics Concern  . Not on file   Social History Narrative   birth control, husband had a vasectomy           Family History  Problem Relation Age of Onset  . Coronary artery disease Father     several MIs, CABG  . Hypertension Father   . Diabetes Father   . Parkinsonism Father   . Diabetes Sister   . Stroke      GM  . Colon cancer Paternal Grandfather   . Stroke Sister   . Breast cancer Other     G aunt       Medication List  This list is accurate as of: 08/09/15 11:59 PM.  Always use your most recent med list.               carvedilol 25 MG tablet  Commonly known as:  COREG  Take 1 tablet (25 mg total) by mouth 2 (two) times daily with a meal.     cloNIDine 0.1 MG tablet  Commonly known as:  CATAPRES  Take 1 tablet (0.1 mg total) by mouth 2 (two) times daily.     felodipine 5 MG 24 hr tablet  Commonly known as:  PLENDIL  Take 1 tablet (5 mg total) by mouth daily.     letrozole 2.5 MG tablet  Commonly known as:  FEMARA  Take 1 tablet by mouth daily.     omeprazole 20 MG capsule  Commonly known as:  PRILOSEC  Take 2 capsules (40 mg total) by mouth 2 (two) times daily before a meal.     simvastatin 40 MG tablet  Commonly known as:  ZOCOR  Take 1 tablet (40 mg total) by mouth every evening.           Objective:   Physical Exam  Skin:      BP 132/84 mmHg  Pulse 90  Temp(Src)  98.4 F (36.9 C) (Oral)  Ht 5\' 7"  (1.702 m)  Wt 249 lb 8 oz (113.172 kg)  BMI 39.07 kg/m2  SpO2 97% General:   Well developed, well nourished . NAD.  HEENT:  Normocephalic . Face symmetric, atraumatic Neck: No thyromegaly Lungs:  CTA B Normal respiratory effort, no intercostal retractions, no accessory muscle use. Heart: RRR,  no murmur.  No pretibial edema bilaterally  Skin: Not pale. Not jaundice Neurologic:  alert & oriented X3.  Speech normal, gait appropriate for age and unassisted Psych--  Cognition and judgment appear intact.  Cooperative with normal attention span and concentration.  Behavior appropriate. No anxious or depressed appearing.      Assessment & Plan:   Assessment > DM HTN Dyslipidemia Depression Breast cancer ---dx 2012, left lumpectomy Acute DVT -R- dx 09-2015, on xarelto x 6 months, saw hem-onc GERD Anemia --- EGD colonoscopy 2008; NovaSure 2010 for excessive bleeding Migraines  Bladder spasms --sees gyn Contraception husband's vasectomy +FH: CAD (father), DM Smoker   Plan Diabetes: Last A1c elevated, was prescribed metformin, patient refused to take it. She is afraid of GI side effects her mother had when she tried that medication. I explained the patient the risk of uncontrolled diabetes including CAD, stroke, renal failure. Also we discussed alternatives such as Januvia or Victoza which could promote weight loss. She is not interested in medications, likes to work on her diet and exercise. I provided information about a local nutritionist as well as websites. GERD: Seen with increased symptoms 06-2015, saw her GI, PPIs dose increase, if no better they are planning a EGD History of breast cancer: Self found a lump on the right breast, her oncologist prescribed a mammogram and ultrasound reportedly normal. Knee pain:Recently saw her orthopedic doctor, meniscal tear ? s/p Local injection, feels better Cyst Left arm likely benign, recommend  observation. Will call if changes

## 2015-08-09 NOTE — Patient Instructions (Signed)
Please come back in 3 months for a routine visit, fasting, (30 min)  Contact [name], she is very good nutritionist [phone]    Two great books to learn about diabetes Diabetes for Dummies "The [facility] Diabetes diet" book   All about diabetes, great resource!  [email or web address]

## 2015-08-09 NOTE — Assessment & Plan Note (Addendum)
Td 2008 ; flu shot and pnm shot -- declined  Female care: per   Dr Marlyss Gust in [place]-- 10-2010 per gyn, normal CCS: Cscope in the past d/t iron def ( 2008 ), had one colon polyp, Dr Gilford Rile in [place] 5-13, Dr Shana Chute , polyp, tics, pt told 5 years Tobacco-- sees the dentist. No counseling today about tobacco, we focus on diabetes diet and exercise.

## 2015-08-09 NOTE — Progress Notes (Signed)
Pre visit review using our clinic review tool, if applicable. No additional management support is needed unless otherwise documented below in the visit note. 

## 2015-08-10 DIAGNOSIS — Z09 Encounter for follow-up examination after completed treatment for conditions other than malignant neoplasm: Secondary | ICD-10-CM | POA: Insufficient documentation

## 2015-08-10 NOTE — Assessment & Plan Note (Signed)
Diabetes: Last A1c elevated, was prescribed metformin, patient refused to take it. She is afraid of GI side effects her mother had when she tried that medication. I explained the patient the risk of uncontrolled diabetes including CAD, stroke, renal failure. Also we discussed alternatives such as Januvia or Victoza which could promote weight loss. She is not interested in medications, likes to work on her diet and exercise. I provided information about a local nutritionist as well as websites. GERD: Seen with increased symptoms 06-2015, saw her GI, PPIs dose increase, if no better they are planning a EGD History of breast cancer: Self found a lump on the right breast, her oncologist prescribed a mammogram and ultrasound reportedly normal. Knee pain:Recently saw her orthopedic doctor, meniscal tear ? s/p Local injection, feels better Cyst Left arm likely benign, recommend observation. Will call if changes

## 2015-09-25 ENCOUNTER — Telehealth: Payer: Self-pay | Admitting: Internal Medicine

## 2015-09-25 ENCOUNTER — Other Ambulatory Visit: Payer: Self-pay | Admitting: Internal Medicine

## 2015-09-25 NOTE — Telephone Encounter (Signed)
Meds filled on 09/25/15.

## 2015-09-25 NOTE — Telephone Encounter (Signed)
Relation to UQ:XAFH Call back [phone] Pharmacy: [place], [place] [phone] (Fax)         Reason for call:  Patient is completely out of the following medication, patient states she placed the request with her pharmacy, patient would like refill today. Please advise  carvedilol (COREG) 25 MG tablet  cloNIDine (CATAPRES) 0.1 MG tablet  felodipine (PLENDIL) 5 MG 24 hr tablet

## 2015-10-13 ENCOUNTER — Encounter: Payer: Self-pay | Admitting: Internal Medicine

## 2015-10-16 ENCOUNTER — Other Ambulatory Visit: Payer: Self-pay

## 2015-10-16 DIAGNOSIS — I10 Essential (primary) hypertension: Secondary | ICD-10-CM

## 2015-10-16 MED ORDER — SIMVASTATIN 40 MG PO TABS: 40.0000 mg | ORAL_TABLET | Freq: Every evening | ORAL | Status: DC

## 2015-11-10 ENCOUNTER — Ambulatory Visit: Payer: Managed Care, Other (non HMO) | Admitting: Internal Medicine

## 2015-12-11 ENCOUNTER — Other Ambulatory Visit: Payer: Self-pay

## 2015-12-11 ENCOUNTER — Telehealth: Payer: Self-pay | Admitting: Internal Medicine

## 2015-12-11 ENCOUNTER — Ambulatory Visit: Payer: Managed Care, Other (non HMO) | Admitting: Internal Medicine

## 2015-12-11 MED ORDER — OMEPRAZOLE 20 MG PO CPDR: 20.0000 mg | DELAYED_RELEASE_CAPSULE | Freq: Every day | ORAL | Status: DC

## 2015-12-11 NOTE — Telephone Encounter (Signed)
Rx has been sent to [facility], first no show, no charge.

## 2015-12-11 NOTE — Telephone Encounter (Signed)
[facility] [place] - [address]  Pt states RX needs changed back to omeprazole 20mg  1xday. She is out of it.

## 2015-12-11 NOTE — Telephone Encounter (Signed)
Pt thought appt was tomorrow. She apologized for missing appt. Left VM 1/23 10:22am stating she thought it was tomorrow. Rescheduled for 12/18/15, charge or no charge for no show?

## 2015-12-18 ENCOUNTER — Encounter: Payer: Self-pay | Admitting: Internal Medicine

## 2015-12-18 ENCOUNTER — Ambulatory Visit (INDEPENDENT_AMBULATORY_CARE_PROVIDER_SITE_OTHER): Payer: Managed Care, Other (non HMO) | Admitting: Internal Medicine

## 2015-12-18 VITALS — BP 104/70 | HR 91 | Temp 98.4°F | Ht 67.0 in | Wt 252.8 lb

## 2015-12-18 DIAGNOSIS — E785 Hyperlipidemia, unspecified: Secondary | ICD-10-CM

## 2015-12-18 DIAGNOSIS — E119 Type 2 diabetes mellitus without complications: Secondary | ICD-10-CM | POA: Diagnosis not present

## 2015-12-18 DIAGNOSIS — I10 Essential (primary) hypertension: Secondary | ICD-10-CM | POA: Diagnosis not present

## 2015-12-18 LAB — LIPID PANEL
CHOLESTEROL: 139 mg/dL (ref 0–200)
HDL: 30.6 mg/dL — ABNORMAL LOW (ref >39.00)
LDL Cholesterol: 80 mg/dL (ref 0–99)
NonHDL: 108.46
Total CHOL/HDL Ratio: 5
Triglycerides: 143 mg/dL (ref 0.0–149.0)
VLDL: 28.6 mg/dL (ref 0.0–40.0)

## 2015-12-18 LAB — BASIC METABOLIC PANEL
BUN: 11 mg/dL (ref 6–23)
CALCIUM: 9.3 mg/dL (ref 8.4–10.5)
CO2: 27 mEq/L (ref 19–32)
CREATININE: 0.93 mg/dL (ref 0.40–1.20)
Chloride: 103 mEq/L (ref 96–112)
GFR: 67.19 mL/min (ref >60.00)
Glucose, Bld: 197 mg/dL — ABNORMAL HIGH (ref 70–99)
Potassium: 4.8 mEq/L (ref 3.5–5.1)
Sodium: 138 mEq/L (ref 135–145)

## 2015-12-18 LAB — HEMOGLOBIN A1C: HEMOGLOBIN A1C: 8.3 % — AB (ref 4.6–6.5)

## 2015-12-18 NOTE — Progress Notes (Signed)
Subjective:    Patient ID: Wanda Ortega, female    DOB: [birth year], 55 y.o.   MRN: [id number]  DOS:  12/18/2015 Type of visit - description : Follow-up Interval history: DM: No major changes on her lifestyle. HTN: Have some problems refillin her meds but now is taking them regularly, BP today slightly low, she feels well.    Review of Systems Still having a lot of stress. Still smoking, has cut down significantly according to the patient  Past Medical History  Diagnosis Date  . Hypertension   . Dyslipidemia   . Colon polyps   . Anemia     EGD and Cscope 2008; Novasure 2010 for excessive vag bleed  . Migraines   . Depression   . Breast cancer ([place]) 05/21/2011  . Contraception     husband's vasectomy  . GERD (gastroesophageal reflux disease) 2016    Dr. Shana Chute    Past Surgical History  Procedure Laterality Date  . Cholecystectomy    . Novasure ablation  01/2009  . Breast lumpectomy Left July 2012     Social History   Social History  . Marital Status: Married    Spouse Name: N/A  . Number of Children: 1  . Years of Education: N/A   Occupational History  . not working at present    .     Social History Main Topics  . Smoking status: Current Every Day Smoker -- 0.50 packs/day    Types: Cigarettes  . Smokeless tobacco: Never Used     Comment: 0.5 PPD  . Alcohol Use: No  . Drug Use: No  . Sexual Activity: Yes    Birth Control/ Protection: Surgical     Comment: husband had vasectomy    Other Topics Concern  . Not on file   Social History Narrative   birth control, husband had a vasectomy              Medication List       This list is accurate as of: 12/18/15  5:41 PM.  Always use your most recent med list.               aspirin 81 MG tablet  Take 81 mg by mouth daily.     carvedilol 25 MG tablet  Commonly known as:  COREG  TAKE 1 TABLET BY MOUTH 2 TIMES DAILY WITH A MEAL     cloNIDine 0.1 MG tablet  Commonly known as:  CATAPRES    take 1 tablet by mouth twice a day     felodipine 5 MG 24 hr tablet  Commonly known as:  PLENDIL  take 1 tablet by mouth once daily     letrozole 2.5 MG tablet  Commonly known as:  FEMARA  Take 1 tablet by mouth daily.     omeprazole 20 MG capsule  Commonly known as:  PRILOSEC  Take 1 capsule (20 mg total) by mouth daily.     simvastatin 40 MG tablet  Commonly known as:  ZOCOR  Take 1 tablet (40 mg total) by mouth every evening.           Objective:   Physical Exam BP 104/70 mmHg  Pulse 91  Temp(Src) 98.4 F (36.9 C) (Oral)  Ht 5\' 7"  (1.702 m)  Wt 252 lb 12.8 oz (114.669 kg)  BMI 39.58 kg/m2  SpO2 98% General:   Well developed, well nourished . NAD.  HEENT:  Normocephalic . Face symmetric, atraumatic Lungs:  CTA  B Normal respiratory effort, no intercostal retractions, no accessory muscle use. Heart: RRR,  no murmur.  No pretibial edema bilaterally  Diabetic feet exam: Normal pedal pulses, normal pinprick examination. Skin is slightly dry but no wounds Neurologic:  alert & oriented X3.  Speech normal, gait appropriate for age and unassisted Psych--  Cognition and judgment appear intact.  Cooperative with normal attention span and concentration.  Behavior appropriate. No anxious or depressed appearing.      Assessment & Plan:   Assessment > DM HTN Dyslipidemia Depression - declined SSRIs before  Breast cancer ---dx 2012, left lumpectomy Acute DVT -R- dx 09-2015, on xarelto x 6 months, saw hem-onc GERD Anemia --- EGD colonoscopy 2008; NovaSure 2010 for excessive bleeding Migraines  Bladder spasms --sees gyn Contraception husband's vasectomy +FH: CAD (father), DM Smoker   PLAN DM: Since the last time she was here, has not change her lifestyle. Will check A1c, she is willing to take metformin if needed. Feet exam negative today. HTN: BP today slightly low, asx, continue Coreg, clonidine, Plendil. Check a BMP High cholesterol: Continue simvastatin,  check a FLP Primary care: Strongly declined flu  or pneumonia shot, benefits - particularly on a diabetic patient - discussed. RTC 3-4 months

## 2015-12-18 NOTE — Progress Notes (Signed)
Pre visit review using our clinic review tool, if applicable. No additional management support is needed unless otherwise documented below in the visit note. 

## 2015-12-18 NOTE — Patient Instructions (Signed)
BEFORE YOU LEAVE THE OFFICE:  GO TO THE LAB : Get the blood work    GO TO THE FRONT DESK Schedule a routine office visit or check up to be done in  3-4 months  No  fasting

## 2015-12-19 NOTE — Addendum Note (Signed)
Addended byDamita Dunnings D on: 12/19/2015 11:49 AM   Modules accepted: Orders

## 2016-01-17 ENCOUNTER — Encounter: Payer: Self-pay | Admitting: *Deleted

## 2016-01-17 ENCOUNTER — Encounter: Payer: Managed Care, Other (non HMO) | Attending: Internal Medicine | Admitting: *Deleted

## 2016-01-17 VITALS — Ht 67.0 in | Wt 254.7 lb

## 2016-01-17 DIAGNOSIS — E119 Type 2 diabetes mellitus without complications: Secondary | ICD-10-CM | POA: Diagnosis present

## 2016-01-17 NOTE — Patient Instructions (Signed)
Eat three  balanced meals per day Always have Protein and carbohydrates together Try to eat in relation ship to "My Plate" Consider activity - The goal of activity is 150 minutes for week. If you have physical challenges consider exercising in water.  Come back and see me in about a month and we will see how you are doing and move as as you feel is appropriate.  Try to discontinue the Regular [place]

## 2016-01-17 NOTE — Progress Notes (Signed)
Diabetes Self-Management Education  Visit Type: First/Initial  Appt. Start Time: 1400 Appt. End Time: [id number]  01/17/2016  Ms. Ike Bene, identified by name and date of birth, is a 55 y.o. female with a diagnosis of Diabetes: Type 2. Patient presents with her daughter Theadora Rama. Delyn notes she is under a great deal of stress at home. Her step son-in-law has moved i with her family. Her husband is very restricting on what she can buy at the grocery. He does not approve of her buying items specifically for her. It was recommended that she sit down and explain to him what her needs are and the consequences of poorly manage T2DM.  ASSESSMENT  Height 5\' 7"  (1.702 m), weight 254 lb 11.2 oz (115.531 kg). Body mass index is 39.88 kg/(m^2).      Diabetes Self-Management Education - 01/17/16 1416    Visit Information   Visit Type First/Initial   Initial Visit   Diabetes Type Type 2   Are you currently following a meal plan? No   Are you taking your medications as prescribed? Yes   Date Diagnosed 2016   Health Coping   How would you rate your overall health? Poor   Psychosocial Assessment   Patient Belief/Attitude about Diabetes Other (comment)  Andgry, sad   Self-care barriers None   Self-management support Doctor's office;Family;CDE visits   Other persons present Patient;Family Member  Daughter Brandy   Patient Concerns Nutrition/Meal planning;Medication;Healthy Lifestyle;Weight Control;Glycemic Control   Special Needs None   Preferred Learning Style No preference indicated   Learning Readiness Change in progress   How often do you need to have someone help you when you read instructions, pamphlets, or other written materials from your doctor or pharmacy? 2 - Rarely   What is the last grade level you completed in school? [id number] Grade   Complications   Last HgB A1C per patient/outside source 8.3 %   How often do you check your blood sugar? Not recommended by provider   Have you had a  dilated eye exam in the past 12 months? No   Have you had a dental exam in the past 12 months? Yes   Are you checking your feet? Yes   How many days per week are you checking your feet? 7   Dietary Intake   Breakfast 9:30-11:00ish quick grits 3/4C,coffee hazelnut creamer / sugar smacks 2-4C , whole milk   Lunch none   Dinner 6:30 - spaghetti 1C red meat sauce, 2 slices garlic toast [id number], /  pasta, onion, garlic, soy sauce, / fried chicken , creamed potatoes, green beans   Snack (evening) 11:00 - chester Hot Balinda Quails milk   Exercise   Exercise Type ADL's   Patient Education   Previous Diabetes Education No   Disease state  Definition of diabetes, type 1 and 2, and the diagnosis of diabetes;Factors that contribute to the development of diabetes   Nutrition management  Role of diet in the treatment of diabetes and the relationship between the three main macronutrients and blood glucose level   Monitoring Daily foot exams   Chronic complications Relationship between chronic complications and blood glucose control;Assessed and discussed foot care and prevention of foot problems;Retinopathy and reason for yearly dilated eye exams;Identified and discussed with patient  current chronic complications;Lipid levels, blood glucose control and heart disease   Psychosocial adjustment Role of stress on diabetes;Worked with patient to identify barriers to care and solutions;Identified and addressed patients feelings and concerns about diabetes  Personal strategies to promote health Lifestyle issues that need to be addressed for better diabetes care   Individualized Goals (developed by patient)   Nutrition General guidelines for healthy choices and portions discussed   Reducing Risk do foot checks daily;increase portions of healthy fats   Outcomes   Expected Outcomes Demonstrated interest in learning. Expect positive outcomes  Highly engaged but expect minimal result due to domestic stress.   Future DMSE  4-6 wks   Program Status Completed      Individualized Plan for Diabetes Self-Management Training:   Learning Objective:  Patient will have a greater understanding of diabetes self-management. Patient education plan is to attend individual and/or group sessions per assessed needs and concerns.   Plan:   Patient Instructions  Eat three  balanced meals per day Always have Protein and carbohydrates together Try to eat in relation ship to "My Plate" Consider activity - The goal of activity is 150 minutes for week. If you have physical challenges consider exercising in water.  Come back and see me in about a month and we will see how you are doing and move as as you feel is appropriate.  Try to discontinue the Regular Soda  Expected Outcomes:  Demonstrated interest in learning. Expect positive outcomes (Highly engaged but expect minimal result due to domestic stress.)  Education material provided: Living Well with Diabetes, Food label handouts, A1C conversion sheet, Meal plan card, My Plate, Snack sheet and Support group flyer  If problems or questions, patient to contact team via:  Phone  Future DSME appointment: 4-6 wks

## 2016-01-19 ENCOUNTER — Other Ambulatory Visit: Payer: Self-pay | Admitting: Internal Medicine

## 2016-01-19 MED ORDER — CLONIDINE HCL 0.1 MG PO TABS: 0.1000 mg | ORAL_TABLET | Freq: Two times a day (BID) | ORAL | Status: DC

## 2016-01-19 MED ORDER — FELODIPINE ER 5 MG PO TB24: 5.0000 mg | ORAL_TABLET | Freq: Every day | ORAL | Status: DC

## 2016-01-19 MED ORDER — CARVEDILOL 25 MG PO TABS: 25.0000 mg | ORAL_TABLET | Freq: Two times a day (BID) | ORAL | Status: DC

## 2016-01-19 NOTE — Telephone Encounter (Signed)
Requested Rx's sent.  

## 2016-02-25 ENCOUNTER — Other Ambulatory Visit: Payer: Self-pay | Admitting: Internal Medicine

## 2016-02-26 MED ORDER — OMEPRAZOLE 20 MG PO CPDR: 20.0000 mg | DELAYED_RELEASE_CAPSULE | Freq: Every day | ORAL | Status: DC

## 2016-02-26 NOTE — Telephone Encounter (Signed)
Rx sent 

## 2016-03-13 ENCOUNTER — Ambulatory Visit: Payer: Managed Care, Other (non HMO) | Admitting: *Deleted

## 2016-03-19 ENCOUNTER — Ambulatory Visit: Payer: Managed Care, Other (non HMO) | Admitting: Skilled Nursing Facility1

## 2016-04-08 ENCOUNTER — Ambulatory Visit: Payer: Managed Care, Other (non HMO) | Admitting: Skilled Nursing Facility1

## 2016-04-18 ENCOUNTER — Ambulatory Visit: Payer: Managed Care, Other (non HMO) | Admitting: Internal Medicine

## 2016-05-03 ENCOUNTER — Encounter: Payer: Self-pay | Admitting: Internal Medicine

## 2016-05-03 ENCOUNTER — Ambulatory Visit (INDEPENDENT_AMBULATORY_CARE_PROVIDER_SITE_OTHER): Payer: Managed Care, Other (non HMO) | Admitting: Internal Medicine

## 2016-05-03 VITALS — BP 122/72 | HR 80 | Temp 97.7°F | Ht 67.0 in | Wt 245.5 lb

## 2016-05-03 DIAGNOSIS — I10 Essential (primary) hypertension: Secondary | ICD-10-CM

## 2016-05-03 DIAGNOSIS — E785 Hyperlipidemia, unspecified: Secondary | ICD-10-CM

## 2016-05-03 DIAGNOSIS — E118 Type 2 diabetes mellitus with unspecified complications: Secondary | ICD-10-CM | POA: Diagnosis not present

## 2016-05-03 LAB — BASIC METABOLIC PANEL
BUN: 11 mg/dL (ref 6–23)
CHLORIDE: 108 meq/L (ref 96–112)
CO2: 29 mEq/L (ref 19–32)
Calcium: 9.5 mg/dL (ref 8.4–10.5)
Creatinine, Ser: 0.85 mg/dL (ref 0.40–1.20)
GFR: 74.44 mL/min (ref >60.00)
Glucose, Bld: 131 mg/dL — ABNORMAL HIGH (ref 70–99)
POTASSIUM: 4.8 meq/L (ref 3.5–5.1)
Sodium: 143 mEq/L (ref 135–145)

## 2016-05-03 LAB — HEMOGLOBIN A1C: HEMOGLOBIN A1C: 7.4 % — AB (ref 4.6–6.5)

## 2016-05-03 NOTE — Progress Notes (Signed)
Pre visit review using our clinic review tool, if applicable. No additional management support is needed unless otherwise documented below in the visit note. 

## 2016-05-03 NOTE — Patient Instructions (Signed)
GO TO THE LAB : Get the blood work     GO TO THE FRONT DESK Schedule your next appointment for a physical exam in 3-4 months, fasting  

## 2016-05-03 NOTE — Progress Notes (Signed)
Subjective:    Patient ID: Wanda Ortega, female    DOB: 07-14-1961, 55 y.o.   MRN: [id number]  DOS:  05/03/2016 Type of visit - description : rov Interval history: Since the last time she was here, she saw a nutritionist, declined to take metformin. She was provided a glucometer, CBGs range from the 60s to 133. Has not changed her diet Her father has been very sick, actually died few days ago and she has been taking care of him.  Med compliance: doesn't  recall to take aspirin every day  Review of Systems Denies any lower extremity paresthesias.   Past Medical History  Diagnosis Date  . Hypertension   . Dyslipidemia   . Colon polyps   . Anemia     EGD and Cscope 2008; Novasure 2010 for excessive vag bleed  . Migraines   . Depression   . Breast cancer ([place]) 05/21/2011  . Contraception     husband's vasectomy  . GERD (gastroesophageal reflux disease) 2016    Dr. Shana Chute  . Diabetes mellitus without complication [facility])     Past Surgical History  Procedure Laterality Date  . Cholecystectomy    . Novasure ablation  01/2009  . Breast lumpectomy Left July 2012     Social History   Social History  . Marital Status: Married    Spouse Name: N/A  . Number of Children: 1  . Years of Education: N/A   Occupational History  . not working at present    .     Social History Main Topics  . Smoking status: Current Every Day Smoker -- 0.50 packs/day    Types: Cigarettes  . Smokeless tobacco: Never Used     Comment: 0.5 PPD  . Alcohol Use: No  . Drug Use: No  . Sexual Activity: Yes    Birth Control/ Protection: Surgical     Comment: husband had vasectomy    Other Topics Concern  . Not on file   Social History Narrative   birth control, husband had a vasectomy              Medication List       This list is accurate as of: 05/03/16 11:59 PM.  Always use your most recent med list.               aspirin 81 MG tablet  Take 81 mg by mouth daily. Reported  on 05/03/2016     carvedilol 25 MG tablet  Commonly known as:  COREG  Take 1 tablet (25 mg total) by mouth 2 (two) times daily with a meal.     cloNIDine 0.1 MG tablet  Commonly known as:  CATAPRES  Take 1 tablet (0.1 mg total) by mouth 2 (two) times daily.     felodipine 5 MG 24 hr tablet  Commonly known as:  PLENDIL  Take 1 tablet (5 mg total) by mouth daily.     letrozole 2.5 MG tablet  Commonly known as:  FEMARA  Take 1 tablet by mouth daily.     omeprazole 20 MG capsule  Commonly known as:  PRILOSEC  Take 1 capsule (20 mg total) by mouth daily.     simvastatin 40 MG tablet  Commonly known as:  ZOCOR  Take 1 tablet (40 mg total) by mouth every evening.           Objective:   Physical Exam BP 122/72 mmHg  Pulse 80  Temp(Src) 97.7 F (36.5 C) (  Oral)  Ht 5\' 7"  (1.702 m)  Wt 245 lb 8 oz (111.358 kg)  BMI 38.44 kg/m2  SpO2 97% General:   Well developed, well nourished . NAD.  HEENT:  Normocephalic . Face symmetric, atraumatic Lungs:  CTA B Normal respiratory effort, no intercostal retractions, no accessory muscle use. Heart: RRR,  no murmur.  No pretibial edema bilaterally  DIABETIC FEET EXAM: No lower extremity edema Normal pedal pulses bilaterally Skin normal, nails normal, no calluses Pinprick examination of the feet normal.  Neurologic:  alert & oriented X3.  Speech normal, gait appropriate for age and unassisted Psych--  Cognition and judgment appear intact.  Cooperative with normal attention span and concentration.  Behavior appropriate. Tearful when we talk about her father     Assessment & Plan:   Assessment > DM HTN Dyslipidemia Depression - declined SSRIs before  Breast cancer ---dx 2012, left lumpectomy Acute DVT -R- dx 09-2015, on xarelto x 6 months, saw hem-onc GERD Anemia --- EGD colonoscopy 2008; NovaSure 2010 for excessive bleeding Migraines  Bladder spasms --sees gyn Contraception husband's vasectomy +FH: CAD (father),  DM Smoker   PLAN DM: Last A1c elevated, declined metformin, saw a nutritionist, did not change her diet, has been busy taking care of her father. Plan: I again encouraged to change her lifestyle, check A1c. HTN: Controlled, continue carvedilol, Plendil, Catapres. Check a BMP cholesterol -- controlled , continue simvastatin Primary care: Encouraged to take ASA qd, did not counsel her about tobacco today, she was somewhat distressed about the loss of her father (pt is counseled), will do on the next opportunity RTC 3-4 months, CPX

## 2016-05-04 NOTE — Assessment & Plan Note (Signed)
DM: Last A1c elevated, declined metformin, saw a nutritionist, did not change her diet, has been busy taking care of her father. Plan: I again encouraged to change her lifestyle, check A1c. HTN: Controlled, continue carvedilol, Plendil, Catapres. Check a BMP cholesterol -- controlled , continue simvastatin Primary care: Encouraged to take ASA qd, did not counsel her about tobacco today, she was somewhat distressed about the loss of her father (pt is counseled), will do on the next opportunity RTC 3-4 months, CPX

## 2016-08-12 ENCOUNTER — Encounter: Payer: Managed Care, Other (non HMO) | Admitting: Internal Medicine

## 2016-09-30 LAB — HM MAMMOGRAPHY

## 2016-10-03 ENCOUNTER — Encounter: Payer: Self-pay | Admitting: Internal Medicine

## 2016-10-22 ENCOUNTER — Other Ambulatory Visit: Payer: Self-pay | Admitting: Internal Medicine

## 2016-10-22 DIAGNOSIS — I10 Essential (primary) hypertension: Secondary | ICD-10-CM

## 2016-11-12 ENCOUNTER — Ambulatory Visit (INDEPENDENT_AMBULATORY_CARE_PROVIDER_SITE_OTHER): Payer: Managed Care, Other (non HMO) | Admitting: Internal Medicine

## 2016-11-12 ENCOUNTER — Encounter: Payer: Self-pay | Admitting: Internal Medicine

## 2016-11-12 VITALS — BP 124/68 | HR 78 | Temp 97.8°F | Resp 14 | Ht 67.0 in | Wt 244.5 lb

## 2016-11-12 DIAGNOSIS — R1310 Dysphagia, unspecified: Secondary | ICD-10-CM

## 2016-11-12 DIAGNOSIS — E119 Type 2 diabetes mellitus without complications: Secondary | ICD-10-CM

## 2016-11-12 DIAGNOSIS — Z0000 Encounter for general adult medical examination without abnormal findings: Secondary | ICD-10-CM | POA: Diagnosis not present

## 2016-11-12 NOTE — Assessment & Plan Note (Addendum)
Td 2008 ; flu shot and pnm shot -- declined again, benefits discussed. She is high risk for complications from pneumonia and influenza. Female care:  Used to see Dr Shardea Gust, now sees Dr Ileene Patrick in [facility])  CCS: Cscope in the past d/t iron def ( 2008 ), had one colon polyp, Dr Gilford Rile in [place] 5-13, Dr Shana Chute , polyp, tics, pt told 5 years Tobacco use, diet, exercise ---->  counseled, at some point plans to go back on Chantix  Not taking aspirin regularly, encouraged to do

## 2016-11-12 NOTE — Patient Instructions (Signed)
GO TO THE LAB : Get the blood work     GO TO THE FRONT DESK Schedule your next appointment for a  routine checkup in 4-6 months    Please visit the following websites for the information about healthy diet, diabetes- The American Heart Association, http://www.heart.org  The American diabetes Association  Http://www.diabetes.org  The  [facility] website  [email or web address]

## 2016-11-12 NOTE — Progress Notes (Signed)
Subjective:    Patient ID: Wanda Ortega, female    DOB: 1961/03/29, 55 y.o.   MRN: [id number]  DOS:  11/12/2016 Type of visit - description : CPX Interval history: In addition to her CPX, we talk about her chronic medical problems and some acute issues.  Wt Readings from Last 3 Encounters:  11/12/16 244 lb 8 oz (110.9 kg)  05/03/16 245 lb 8 oz (111.4 kg)  01/17/16 254 lb 11.2 oz (115.5 kg)     Review of Systems  Constitutional: No fever. No chills. No unexplained wt changes. No unusual sweats  HEENT: has ressesing gum, plans to do with her dentist, no ear discharge, no facial swelling, no voice changes. Complaining of dry mouth for about 2 years, denies dry eyes. No eye discharge, no eye  redness , no  intolerance to light   Respiratory: No wheezing , no  difficulty breathing. No cough , no mucus production  Cardiovascular: No CP, no leg swelling , no  Palpitations  GI:  Long history of dysphagia to solids, much increase in the last 4-5 months. Minimal heartburn. No symptoms with fluids intake. No weight loss. Some odynophagia  no nausea, no vomiting, no diarrhea , no  abdominal pain.  No blood in the stools.      Endocrine: No polyphagia, no polyuria , no polydipsia  GU: No dysuria, gross hematuria, difficulty urinating. No urinary urgency, no frequency.  Musculoskeletal: Chronic left knee pain  Skin: No change in the color of the skin, palor , no  Rash  Allergic, immunologic: No environmental allergies , no  food allergies  Neurological: No dizziness no  syncope. No headaches. No diplopia, no slurred, no slurred speech, no motor deficits, no facial  Numbness  Hematological: No enlarged lymph nodes, no easy bruising , no unusual bleedings  Psychiatry: No suicidal ideas, no hallucinations, no beavior problems, no confusion.  First Christmas without her father, obviously affected by this   Past Medical History:  Diagnosis Date  . Anemia    EGD and Cscope  2008; Novasure 2010 for excessive vag bleed  . Breast cancer ([place]) 05/21/2011  . Colon polyps   . Contraception    husband's vasectomy  . Depression   . Diabetes mellitus without complication ([place])   . Dyslipidemia   . GERD (gastroesophageal reflux disease) 2016   Dr. Shana Chute  . Hypertension   . Migraines     Past Surgical History:  Procedure Laterality Date  . BREAST LUMPECTOMY Left July 2012   . CHOLECYSTECTOMY    . NOVASURE ABLATION  01/2009    Social History   Social History  . Marital status: Married    Spouse name: N/A  . Number of children: 1  . Years of education: N/A   Occupational History  . not working at present     Social History Main Topics  . Smoking status: Current Every Day Smoker    Packs/day: 0.50    Types: Cigarettes  . Smokeless tobacco: Never Used     Comment: > 0.5 PPD  . Alcohol use No  . Drug use: No  . Sexual activity: Yes    Birth control/ protection: Surgical     Comment: husband had vasectomy    Other Topics Concern  . Not on file   Social History Narrative   birth control, husband had a vasectomy         Family History  Problem Relation Age of Onset  . Coronary artery disease  Father     several MIs, CABG  . Hypertension Father   . Diabetes Father   . Parkinsonism Father   . Diabetes Sister   . Stroke      GM  . Colon cancer Paternal Grandfather   . Stroke Sister   . Breast cancer Other     G aunt      Allergies as of 11/12/2016   No Known Allergies     Medication List       Accurate as of 11/12/16  6:20 PM. Always use your most recent med list.          aspirin 81 MG tablet Take 81 mg by mouth daily. Reported on 05/03/2016   carvedilol 25 MG tablet Commonly known as:  COREG Take 1 tablet (25 mg total) by mouth 2 (two) times daily with a meal.   cloNIDine 0.1 MG tablet Commonly known as:  CATAPRES Take 1 tablet (0.1 mg total) by mouth 2 (two) times daily.   felodipine 5 MG 24 hr tablet Commonly known  as:  PLENDIL Take 1 tablet (5 mg total) by mouth daily.   letrozole 2.5 MG tablet Commonly known as:  FEMARA Take 1 tablet by mouth daily.   omeprazole 20 MG capsule Commonly known as:  PRILOSEC Take 1 capsule (20 mg total) by mouth daily.   simvastatin 40 MG tablet Commonly known as:  ZOCOR Take 1 tablet (40 mg total) by mouth every evening.          Objective:   Physical Exam BP 124/68 (BP Location: Left Arm, Patient Position: Sitting, Cuff Size: Normal)   Pulse 78   Temp 97.8 F (36.6 C) (Oral)   Resp 14   Ht 5\' 7"  (1.702 m)   Wt 244 lb 8 oz (110.9 kg)   SpO2 96%   BMI 38.29 kg/m   General:   Well developed, well nourished . NAD.  Neck: No  thyromegaly  HEENT:  Normocephalic . Face symmetric, atraumatic Lungs:  CTA B Normal respiratory effort, no intercostal retractions, no accessory muscle use. Heart: RRR,  no murmur.  No pretibial edema bilaterally  Abdomen:  Not distended, soft, non-tender. No rebound or rigidity.   Skin: Exposed areas without rash. Not pale. Not jaundice Neurologic:  alert & oriented X3.  Speech normal, gait appropriate for age and unassisted Strength symmetric and appropriate for age.  Psych: Cognition and judgment appear intact.  Cooperative with normal attention span and concentration.  Behavior appropriate. No anxious or depressed appearing.    Assessment & Plan:  Assessment > DM HTN Dyslipidemia Depression - declined SSRIs before  Breast cancer ---dx 2012, left lumpectomy Smoker GERD Migraines  Bladder spasms --sees gyn Contraception husband's vasectomy +FH: CAD (father), DM H/o Anemia --- EGD colonoscopy 2008; NovaSure 2010 for excessive bleeding H/O Acute DVT -R- dx 09-2015, on xarelto x 6 months, saw hem-onc   PLAN DM-poorly controlled, I again explained to her the risks of uncontrolled diabetes including CAD, stroke, kidney failure and blindness. States she won't take medications. She drinks regular soda  sometimes, states she won't give that up  but plans to eat healthier. Offered a referral to a nutritionist : declined HTN: Seems well-controlled Left knee pain: Already seen by orthopedic, recommended an MRI and possible surgery, unable to do that mostly due to cost. Rec ice, knee sleeve. Weight loss will help also. Dysphagia: Chronic but worse over the last few months, refer to GI. h/o breast cancer-scheduled to stop femara  in 3 months  RTC 4-6 months

## 2016-11-12 NOTE — Assessment & Plan Note (Signed)
PLAN DM-poorly controlled, I again explained to her the risks of uncontrolled diabetes including CAD, stroke, kidney failure and blindness. States she won't take medications. She drinks regular soda sometimes, states she won't give that up  but plans to eat healthier. Offered a referral to a nutritionist : declined HTN: Seems well-controlled Left knee pain: Already seen by orthopedic, recommended an MRI and possible surgery, unable to do that mostly due to cost. Rec ice, knee sleeve. Weight loss will help also. Dysphagia: Chronic but worse over the last few months, refer to GI. h/o breast cancer-scheduled to stop femara in 3 months  RTC 4-6 months

## 2016-11-12 NOTE — Progress Notes (Signed)
Pre visit review using our clinic review tool, if applicable. No additional management support is needed unless otherwise documented below in the visit note. 

## 2016-11-13 LAB — BASIC METABOLIC PANEL
BUN: 11 mg/dL (ref 6–23)
CALCIUM: 9 mg/dL (ref 8.4–10.5)
CO2: 27 mEq/L (ref 19–32)
Chloride: 107 mEq/L (ref 96–112)
Creatinine, Ser: 0.76 mg/dL (ref 0.40–1.20)
GFR: 84.53 mL/min (ref >60.00)
GLUCOSE: 108 mg/dL — AB (ref 70–99)
Potassium: 4.5 mEq/L (ref 3.5–5.1)
SODIUM: 142 meq/L (ref 135–145)

## 2016-11-13 LAB — LIPID PANEL
CHOLESTEROL: 144 mg/dL (ref 0–200)
HDL: 33.2 mg/dL — ABNORMAL LOW (ref >39.00)
LDL CALC: 72 mg/dL (ref 0–99)
NONHDL: 111.07
Total CHOL/HDL Ratio: 4
Triglycerides: 193 mg/dL — ABNORMAL HIGH (ref 0.0–149.0)
VLDL: 38.6 mg/dL (ref 0.0–40.0)

## 2016-11-13 LAB — MICROALBUMIN / CREATININE URINE RATIO
CREATININE, U: 247.8 mg/dL
MICROALB UR: 2.4 mg/dL — AB (ref 0.0–1.9)
Microalb Creat Ratio: 1 mg/g (ref 0.0–30.0)

## 2016-11-13 LAB — CBC WITH DIFFERENTIAL/PLATELET
BASOS ABS: 0.1 10*3/uL (ref 0.0–0.1)
BASOS PCT: 0.5 % (ref 0.0–3.0)
EOS ABS: 0.3 10*3/uL (ref 0.0–0.7)
Eosinophils Relative: 3.3 % (ref 0.0–5.0)
HEMATOCRIT: 37.9 % (ref 36.0–46.0)
Hemoglobin: 12.8 g/dL (ref 12.0–15.0)
LYMPHS ABS: 3.8 10*3/uL (ref 0.7–4.0)
Lymphocytes Relative: 36.4 % (ref 12.0–46.0)
MCHC: 33.7 g/dL (ref 30.0–36.0)
MCV: 79.6 fl (ref 78.0–100.0)
MONO ABS: 0.6 10*3/uL (ref 0.1–1.0)
Monocytes Relative: 5.8 % (ref 3.0–12.0)
NEUTROS ABS: 5.6 10*3/uL (ref 1.4–7.7)
NEUTROS PCT: 54 % (ref 43.0–77.0)
PLATELETS: 294 10*3/uL (ref 150.0–400.0)
RBC: 4.76 Mil/uL (ref 3.87–5.11)
RDW: 15.2 % (ref 11.5–15.5)
WBC: 10.4 10*3/uL (ref 4.0–10.5)

## 2016-11-13 LAB — ALT: ALT: 15 U/L (ref 0–35)

## 2016-11-13 LAB — HEMOGLOBIN A1C: Hgb A1c MFr Bld: 7.4 % — ABNORMAL HIGH (ref 4.6–6.5)

## 2016-11-13 LAB — AST: AST: 16 U/L (ref 0–37)

## 2016-11-19 ENCOUNTER — Other Ambulatory Visit: Payer: Self-pay | Admitting: Internal Medicine

## 2016-12-17 ENCOUNTER — Other Ambulatory Visit: Payer: Self-pay | Admitting: Internal Medicine

## 2017-03-05 LAB — HM COLONOSCOPY

## 2017-03-20 LAB — HM DEXA SCAN

## 2017-04-15 ENCOUNTER — Encounter: Payer: Self-pay | Admitting: Internal Medicine

## 2017-04-15 ENCOUNTER — Ambulatory Visit (INDEPENDENT_AMBULATORY_CARE_PROVIDER_SITE_OTHER): Payer: Managed Care, Other (non HMO) | Admitting: Internal Medicine

## 2017-04-15 VITALS — BP 128/72 | HR 74 | Temp 98.2°F | Resp 14 | Ht 67.0 in | Wt 248.2 lb

## 2017-04-15 DIAGNOSIS — Z0189 Encounter for other specified special examinations: Secondary | ICD-10-CM

## 2017-04-15 DIAGNOSIS — I10 Essential (primary) hypertension: Secondary | ICD-10-CM

## 2017-04-15 DIAGNOSIS — E119 Type 2 diabetes mellitus without complications: Secondary | ICD-10-CM | POA: Diagnosis not present

## 2017-04-15 DIAGNOSIS — R1310 Dysphagia, unspecified: Secondary | ICD-10-CM | POA: Diagnosis not present

## 2017-04-15 NOTE — Patient Instructions (Signed)
Next visit in 6 months, please schedule

## 2017-04-15 NOTE — Assessment & Plan Note (Signed)
DM: Has not changed her mind about taking medication, she won't do it. HTN: Good compliance w/ medication. Dyslipidemia: Well-controlled per last FLP H/o breast cancer, saw oncology, Femara stopped Dysphagia: Had a EGD recently, stretched DEXA normal 03/20/2017 OSA? She has sx,+ + RF, Epworth scale 14. Needs further evaluation, risk of a sleep apnea (CAD, stroke) discuss, recommend referral, will arrange Primary care: Declined to take aspirin, or get immunizations. Colonoscopy was done 02-2012, next 5 years per GI letter RTC 6 months

## 2017-04-15 NOTE — Progress Notes (Signed)
Subjective:    Patient ID: Wanda Ortega, female    DOB: 28-Nov-1960, 56 y.o.   MRN: [id number]  DOS:  04/15/2017 Type of visit - description : rov Interval history: Since the last office visit, had a bone density test, EGD and colonoscopy, saw hematology. Notes reviewed. She reports she is feeling quite fatigue, was concerned about sleep apnea because she has a family history.   Review of Systems  Patient's husband reports she snores sometimes and lately has witnessed episodes of apnea. During her recent colonoscopy she heard the anesthesiologist say she has sleep apnea. GI: Had a EGD, reportedly esophagus  was a stretched, dysphagia has decreased but is not completely gone.   Past Medical History:  Diagnosis Date  . Anemia    EGD and Cscope 2008; Novasure 2010 for excessive vag bleed  . Breast cancer ([place]) 05/21/2011  . Colon polyps   . Contraception    husband's vasectomy  . Depression   . Diabetes mellitus without complication ([place])   . Dyslipidemia   . GERD (gastroesophageal reflux disease) 2016   Dr. Shana Chute  . Hypertension   . Migraines     Past Surgical History:  Procedure Laterality Date  . BREAST LUMPECTOMY Left July 2012   . CHOLECYSTECTOMY    . NOVASURE ABLATION  01/2009    Social History   Social History  . Marital status: Married    Spouse name: N/A  . Number of children: 1  . Years of education: N/A   Occupational History  . not working at present     Social History Main Topics  . Smoking status: Current Every Day Smoker    Packs/day: 0.50    Types: Cigarettes  . Smokeless tobacco: Never Used     Comment: > 0.5 PPD  . Alcohol use No  . Drug use: No  . Sexual activity: Yes    Birth control/ protection: Surgical     Comment: husband had vasectomy    Other Topics Concern  . Not on file   Social History Narrative   birth control, husband had a vasectomy            Allergies as of 04/15/2017   No Known Allergies     Medication  List       Accurate as of 04/15/17 11:43 AM. Always use your most recent med list.          aspirin 81 MG tablet Take 81 mg by mouth daily. Reported on 05/03/2016   carvedilol 25 MG tablet Commonly known as:  COREG Take 1 tablet (25 mg total) by mouth 2 (two) times daily with a meal.   cloNIDine 0.1 MG tablet Commonly known as:  CATAPRES Take 1 tablet (0.1 mg total) by mouth 2 (two) times daily.   felodipine 5 MG 24 hr tablet Commonly known as:  PLENDIL Take 1 tablet (5 mg total) by mouth daily.   letrozole 2.5 MG tablet Commonly known as:  FEMARA Take 1 tablet by mouth daily.   omeprazole 20 MG capsule Commonly known as:  PRILOSEC Take 1 capsule (20 mg total) by mouth daily.   simvastatin 40 MG tablet Commonly known as:  ZOCOR Take 1 tablet (40 mg total) by mouth every evening.          Objective:   Physical Exam BP 128/72 (BP Location: Right Arm, Patient Position: Sitting, Cuff Size: Normal)   Pulse 74   Temp 98.2 F (36.8 C) (Oral)  Resp 14   Ht 5\' 7"  (1.702 m)   Wt 248 lb 4 oz (112.6 kg)   SpO2 98%   BMI 38.88 kg/m  General:   Well developed, obese appearing . NAD.  HEENT:  Normocephalic . Face symmetric, atraumatic Lungs:  CTA B Normal respiratory effort, no intercostal retractions, no accessory muscle use. Heart: RRR,  no murmur.  No pretibial edema bilaterally  Skin: Not pale. Not jaundice Neurologic:  alert & oriented X3.  Speech normal, gait appropriate for age and unassisted Psych--  Cognition and judgment appear intact.  Cooperative with normal attention span and concentration.  Behavior appropriate. No anxious or depressed appearing.      Assessment & Plan:   Assessment  DM HTN Dyslipidemia Depression - declined SSRIs before  Breast cancer ---dx 2012, left lumpectomy Smoker GERD: EGD 02-2017: Pathology negative for H. Pylori, esophagus  was a stretched Migraines  Bladder spasms --sees gyn Contraception husband's  vasectomy +FH: CAD (father), DM H/o Anemia --- EGD colonoscopy 2008; NovaSure 2010 for excessive bleeding H/O Acute DVT -R- dx 09-2015, on xarelto x 6 months, saw hem-onc, due to Tamoxifen   PLAN DM: Has not changed her mind about taking medication, she won't do it. HTN: Good compliance w/ medication. Dyslipidemia: Well-controlled per last FLP H/o breast cancer, saw oncology, Femara stopped Dysphagia: Had a EGD recently, stretched DEXA normal 03/20/2017 OSA? She has sx,+ + RF, Epworth scale 14. Needs further evaluation, risk of a sleep apnea (CAD, stroke) discuss, recommend referral, will arrange Primary care: Declined to take aspirin, or get immunizations. Colonoscopy was done 02-2012, next 5 years per GI letter RTC 6 months

## 2017-04-15 NOTE — Progress Notes (Signed)
Pre visit review using our clinic review tool, if applicable. No additional management support is needed unless otherwise documented below in the visit note. 

## 2017-04-17 ENCOUNTER — Ambulatory Visit (INDEPENDENT_AMBULATORY_CARE_PROVIDER_SITE_OTHER): Payer: Managed Care, Other (non HMO) | Admitting: Neurology

## 2017-04-17 ENCOUNTER — Encounter: Payer: Self-pay | Admitting: Neurology

## 2017-04-17 VITALS — BP 108/71 | HR 74 | Resp 20 | Ht 67.0 in | Wt 249.0 lb

## 2017-04-17 DIAGNOSIS — E0800 Diabetes mellitus due to underlying condition with hyperosmolarity without nonketotic hyperglycemic-hyperosmolar coma (NKHHC): Secondary | ICD-10-CM

## 2017-04-17 DIAGNOSIS — G4719 Other hypersomnia: Secondary | ICD-10-CM | POA: Diagnosis not present

## 2017-04-17 DIAGNOSIS — F17200 Nicotine dependence, unspecified, uncomplicated: Secondary | ICD-10-CM

## 2017-04-17 DIAGNOSIS — R0683 Snoring: Secondary | ICD-10-CM | POA: Diagnosis not present

## 2017-04-17 NOTE — Progress Notes (Addendum)
SLEEP MEDICINE CLINIC   Provider:  Larey Seat, M D  Primary Care Physician:  Colon Branch, MD   Referring Provider: Colon Branch, MD  Chief Complaint  Patient presents with  . New Patient (Initial Visit)    snores, never had a sleep study    HPI:  Wanda Ortega is a 56 y.o. female , seen here as in a referral from Dr. Larose Kells for a sleep evaluation.  Wanda Ortega reports that her husband has witnessed her to snore and has apnea and just the other night told her that she stops breathing for 15-20 seconds. He is very concerned about this. She underwent a colonoscopy, endoscopy last month ( 02/2017) and while under sedation her husband was informed by the anesthetist  that she had severe sleep apnea. the patient recalls that she had breathing problems during dental procedures when she was placed in supine position with a retro-flexed neck and couldn't breathe through her nose. 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 She begun snoring at age 84, after she broke her nose.    Sleep habits are as follows: The patient's usual bedtime is between midnight and 1:00 AM, and if she goes to bed earlier she cannot sleep through the night.  She falls easier asleep on the couch. She usually wakes up several times, and about 2 hours after falling asleep she will wake by a bathroom call,  usually she falls asleep again within 15 minutes or so. This however repeats itself several times through the night. She considers a good night if she had 5 or more hours of sleep. Many nights she has less than 4 hours of sleep. She does not have a set rise time but usually is up by 8:30 AM. She does not describe vivid on nightmarish dreams. She does not use daytime naps.  Sleep medical history and family sleep history: Her father was affected by sleep apnea.  He has Parkinson's disease. Her older sister also has sleep apnea. She suffers from dyslipidemia,  diabetes mellitus, reactive depression, gastroesophageal reflux disease, hypertension, migraine headaches, iron deficiency anemia, history of breast cancer in 2012, colon polyps.   "snoring all my adult life" mouth breathing. She had breast cancer and suffered a DVT in 2015 , was on xeralto   Social history:  Married, one daughter.  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 She is a current every day smoker, no ETOH use, caffeine user; tea at night and coffee in am , 3-5 a day. No shift work history.   Review of Systems: Out of a complete 14 system review, the patient complains of only the following symptoms, and all other reviewed systems are negative.   dry mouth, habitual mouth breathing, nasal obstruction. A dry mouth begun bothering her after radiation for breast cancer. Insomnia -paradoxical insomnia that affects her when she is in bed by she sleeps or feels able to fall asleep in daytime. Epworth score 17- , Fatigue severity score n/a  , depression score 5/15     Social History   Social History  . Marital status: Married    Spouse name: N/A  . Number of children: 1  . Years of education: N/A   Occupational History  . not working at present     Social History Main Topics  . Smoking status: Current Every Day Smoker    Packs/day:  0.50    Types: Cigarettes  . Smokeless tobacco: Never Used     Comment: > 0.5 PPD  . Alcohol use No  . Drug use: No  . Sexual activity: Yes    Birth control/ protection: Surgical     Comment: husband had vasectomy    Other Topics Concern  . Not on file   Social History Narrative   birth control, husband had a vasectomy          Family History  Problem Relation Age of Onset  . Coronary artery disease Father        several MIs, CABG  . Hypertension Father   . Diabetes Father   . Parkinsonism Father   . Diabetes Sister   . Stroke Unknown        GM  . Colon cancer Paternal Grandfather   .  Stroke Sister   . Breast cancer Other        G aunt    Past Medical History:  Diagnosis Date  . Anemia    EGD and Cscope 2008; Novasure 2010 for excessive vag bleed  . Breast cancer ([place]) 05/21/2011  . Colon polyps   . Contraception    husband's vasectomy  . Depression   . Diabetes mellitus without complication ([place])   . Dyslipidemia   . GERD (gastroesophageal reflux disease) 2016   Dr. Shana Chute  . Hypertension   . Migraines     Past Surgical History:  Procedure Laterality Date  . BREAST LUMPECTOMY Left July 2012   . CHOLECYSTECTOMY    . NOVASURE ABLATION  01/2009    Current Outpatient Prescriptions  Medication Sig Dispense Refill  . carvedilol (COREG) 25 MG tablet Take 1 tablet (25 mg total) by mouth 2 (two) times daily with a meal. 180 tablet 1  . cloNIDine (CATAPRES) 0.1 MG tablet Take 1 tablet (0.1 mg total) by mouth 2 (two) times daily. 180 tablet 1  . felodipine (PLENDIL) 5 MG 24 hr tablet Take 1 tablet (5 mg total) by mouth daily. 90 tablet 1  . omeprazole (PRILOSEC) 20 MG capsule Take 1 capsule (20 mg total) by mouth daily. 90 capsule 3  . simvastatin (ZOCOR) 40 MG tablet Take 1 tablet (40 mg total) by mouth every evening. 90 tablet 1   No current facility-administered medications for this visit.     Allergies as of 04/17/2017  . (No Known Allergies)    Vitals: BP 108/71   Pulse 74   Resp 20   Ht 5\' 7"  (1.702 m)   Wt 249 lb (112.9 kg)   BMI 39.00 kg/m  Last Weight:  Wt Readings from Last 1 Encounters:  04/17/17 249 lb (112.9 kg)   TKZ:SWFU mass index is 39 kg/m.     Last Height:   Ht Readings from Last 1 Encounters:  04/17/17 5\' 7"  (1.702 m)    Physical exam:  General: The patient is awake, alert and appears not in acute distress. The patient is well groomed. Head: Normocephalic, atraumatic. Neck is supple. Mallampati 2- peaked palate,   left nostril is restricted.  neck circumference:18.5, double chin, . Nasal airflow restricited ,  Retrognathia  is not seen.  Cardiovascular:  Regular rate and rhythm , without  murmurs or carotid bruit, and without distended neck veins. Respiratory: Lungs are clear to auscultation. Skin:  Without evidence of edema, or rash Trunk: BMI is 39. The patient's posture is erect   Neurologic exam : The patient is awake and alert, oriented  to place and time.  Attention span & concentration ability appears normal.  Speech is fluent,  without dysarthria, dysphonia or aphasia. Mood and affect are appropriate.  Cranial nerves:  intact taste and smell. Pupils are equal and briskly reactive to light.  Funduscopic exam without evidence of pallor or edema- . Extraocular movements  in vertical and horizontal planes intact and without nystagmus. Visual fields by finger perimetry are intact. Hearing to finger rub intact.Facial sensation intact to fine touch.  Facial motor strength is symmetric and tongue and uvula move midline. Shoulder shrug was symmetrical.   Motor exam:   Normal tone, muscle bulk and symmetric strength in all extremities. Sensory:  Fine touch, pinprick and vibration were tested in all extremities. Proprioception tested in the upper extremities was normal. Coordination: Rapid alternating movements in the fingers/hands was normal. Finger-to-nose maneuver  normal without evidence of ataxia, dysmetria or tremor. Gait and station: Patient walks without assistive device and is able unassisted to climb up to the exam table. Strength within normal limits. Stance is stable and normal.  Deep tendon reflexes: in the  upper and lower extremities are symmetric and intact. Babinski maneuver response is downgoing.  Assessment:  After physical and neurologic examination, review of laboratory studies,  Personal review of imaging studies, reports of other /same  Imaging studies, results of polysomnography and / or neurophysiology testing and pre-existing records as far as provided in visit., my assessment is ; 1)  hypersomnia. The patient endorsed the Epworth score at 17 points today and at 14 points and Dr. Larose Kells office. She endorsed a 0.4 sleeping in a car stopped in traffic, and one point only for conversations. All other questions were endorsed at 2 and 3 points.   2)She also endorsed insomnia at night and is effectively sleep deprived - we will broke on both angles I will give her a handout for insomnia behavior modification, I will ask her not to drink iced tea with caffeine in the afternoon and evening time, I would like for her to not use the TV or any back lit screen device in the bedroom.  3) the patient has trouble withbreathing, has been a habitual mouth breather, with a larger than usual neck circumference and high BMI. Her husband has witnessed her to snore and have apnea, I have no doubt that she will test positive for apnea.   I will order a split night polysomnography for the patient, sleep hygiene information, and advise on weight loss. Smoking cessation.    The patient was advised of the nature of the diagnosed disorder , the treatment options and the  risks for general health and wellness arising from not treating the condition.   I spent more than 45 minutes of face to face time with the patient.  Greater than 50% of time was spent in counseling and coordination of care. We have discussed the diagnosis and differential and I answered the patient's questions.    Plan:  Treatment plan and additional workup : 1) SPLIT PSG,   2)info for active tobacco user( smokes since age 78 -  2 ppd for 40 years). Did well on Chantix.   3) weight loss  4) sleep hygiene     Larey Seat, MD 6/60/6301, 60:10 AM  Certified in Neurology by ABPN Certified in [place] by [facility] [address] [place], [place] [address]

## 2017-04-17 NOTE — Patient Instructions (Signed)
Preventing Cerebrovascular Disease Arteries are blood vessels that carry blood that contains oxygen from the heart to all parts of the body. Cerebrovascular disease affects arteries that supply the brain. Any condition that blocks or disrupts blood flow to the brain can cause cerebrovascular disease. Brain cells that lose blood supply start to die within minutes (stroke). Stroke is the main danger of cerebrovascular disease. Atherosclerosis and high blood pressure are common causes of cerebrovascular disease. Atherosclerosis is narrowing and hardening of an artery that results when fat, cholesterol, calcium, or other substances (plaque) build up inside an artery. Plaque reduces blood flow through the artery. High blood pressure increases the risk of bleeding inside the brain. Making diet and lifestyle changes to prevent atherosclerosis and high blood pressure lowers your risk of cerebrovascular disease. What nutrition changes can be made?  Eat more fruits, vegetables, and whole grains.  Reduce how much saturated fat you eat. To do this, eat less red meat and fewer full-fat dairy products.  Eat healthy proteins instead of red meat. Healthy proteins include: ? Fish. Eat fish that contains heart-healthy omega-3 fatty acids, twice a week. Examples include salmon, albacore tuna, mackerel, and herring. ? Chicken. ? Nuts. ? Low-fat or nonfat yogurt.  Avoid processed meats, like bacon and lunchmeat.  Avoid foods that contain: ? A lot of sugar, such as sweets and drinks with added sugar. ? A lot of salt (sodium). Avoid adding extra salt to your food, as told by your health care provider. ? Trans fats, such as margarine and baked goods. Trans fats may be listed as "partially hydrogenated oils" on food labels.  Check food labels to see how much sodium, sugar, and trans fats are in foods.  Use vegetable oils that contain low amounts of saturated fat, such as olive oil or canola oil. What lifestyle  changes can be made?  Drink alcohol in moderation. This means no more than 1 drink a day for nonpregnant women and 2 drinks a day for men. One drink equals 12 oz of beer, 5 oz of wine, or 1 oz of hard liquor.  If you are overweight, ask your health care provider to recommend a weight-loss plan for you. Losing 5-10 lb (2.2-4.5 kg) can reduce your risk of diabetes, atherosclerosis, and high blood pressure.  Exercise for 30?60 minutes on most days, or as much as told by your health care provider. ? Do moderate-intensity exercise, such as brisk walking, bicycling, and water aerobics. Ask your health care provider which activities are safe for you.  Do not use any products that contain nicotine or tobacco, such as cigarettes and e-cigarettes. If you need help quitting, ask your health care provider. Why are these changes important? Making these changes lowers your risk of many diseases that can cause cerebrovascular disease and stroke. Stroke is a leading cause of death and disability. Making these changes also improves your overall health and quality of life. What can I do to lower my risk? The following factors make you more likely to develop cerebrovascular disease:  Being overweight.  Smoking.  Being physically inactive.  Eating a high-fat diet.  Having certain health conditions, such as: ? Diabetes. ? High blood pressure. ? Heart disease. ? Atherosclerosis. ? High cholesterol. ? Sickle cell disease.  Talk with your health care provider about your risk for cerebrovascular disease. Work with your health care provider to control diseases that you have that may contribute to cerebrovascular disease. Your health care provider may prescribe medicines to help prevent   major causes of cerebrovascular disease. Where to find more information: Learn more about preventing cerebrovascular disease from:  National Heart, Lung, and Blood Institute:  www.nhlbi.nih.gov/health/health-topics/topics/stroke  Centers for Disease Control and Prevention: cdc.gov/stroke/about.htm  Summary  Cerebrovascular disease can lead to a stroke.  Atherosclerosis and high blood pressure are major causes of cerebrovascular disease.  Making diet and lifestyle changes can reduce your risk of cerebrovascular disease.  Work with your health care provider to get your risk factors under control to reduce your risk of cerebrovascular disease. This information is not intended to replace advice given to you by your health care provider. Make sure you discuss any questions you have with your health care provider. Document Released: 11/19/2015 Document Revised: 05/24/2016 Document Reviewed: 11/19/2015 Elsevier Interactive Patient Education  2018 Elsevier Inc.  

## 2017-05-12 ENCOUNTER — Ambulatory Visit (INDEPENDENT_AMBULATORY_CARE_PROVIDER_SITE_OTHER): Payer: Managed Care, Other (non HMO) | Admitting: Neurology

## 2017-05-12 DIAGNOSIS — G4733 Obstructive sleep apnea (adult) (pediatric): Secondary | ICD-10-CM | POA: Diagnosis not present

## 2017-05-12 DIAGNOSIS — F17200 Nicotine dependence, unspecified, uncomplicated: Secondary | ICD-10-CM

## 2017-05-12 DIAGNOSIS — R0683 Snoring: Secondary | ICD-10-CM

## 2017-05-12 DIAGNOSIS — E0800 Diabetes mellitus due to underlying condition with hyperosmolarity without nonketotic hyperglycemic-hyperosmolar coma (NKHHC): Secondary | ICD-10-CM

## 2017-05-12 DIAGNOSIS — G4719 Other hypersomnia: Secondary | ICD-10-CM

## 2017-05-16 NOTE — Addendum Note (Signed)
Addended by: Larey Seat on: 05/16/2017 02:11 PM   Modules accepted: Orders

## 2017-05-16 NOTE — Procedures (Signed)
[facility]  [facility] [address]. [place], [place] [address] NAME: Wanda Ortega      DOB: 10-01-1961 MEDICAL RECORD [id number]    DOS: 05/13/17 REFERRING PHYSICIAN: Kathlene November, MD     STUDY PERFORMED: HST HISTORY: 56 year old female, presenting with witnessed snoring and apnea. History of excessive daytime sleepiness with an Epworth score of 17 points, current active tobacco use, HTN, Migraine, history of breast cancer, GERD, iron deficiency anemia, DM, DVT.  BMI 39.  STUDY RESULTS: Total Recording Time: 5 h 25 min. Total Apnea/Hypopnea Index (AHI): 40.5 /hr. and RDI of 42.1 /hr.  Average Oxygen Saturation: SpO2 92 %. Lowest Oxygen Saturation: SpO2 nadir of 77%. Desaturation time at or below 88% was 15 minutes. Average Heart Rate: 70 bpm (40-107 bpm)  IMPRESSION: Severe obstructive sleep apnea with loud snoring, but not associated with hypoxemia. Brady Tachycardia noted. RECOMMENDATION: This degree of apnea with the imposed stress on the patient heart will need to be treated with CPAP.  I will order a CPAP auto-titration, 5-20 cm water, heated humidity and mask of patient's choice.  In addition, weight loss and smoking cessation are urgently needed.   I certify that I have reviewed the raw data recording prior to the issuance of this report in accordance with the standards of the American Academy of Sleep Medicine (AASM).  Larey Seat, MD     05-15-2017    [facility] [place]. Diplomat of the ABPN and  AASM

## 2017-05-19 ENCOUNTER — Telehealth: Payer: Self-pay | Admitting: Neurology

## 2017-05-19 NOTE — Telephone Encounter (Signed)
-----   Message from Lester Waunakee, RN sent at 05/19/2017  7:36 AM EDT -----   ----- Message ----- From: Larey Seat, MD Sent: 05/16/2017   2:11 PM To: Lester Beaver, RN, Robin H Hyler  Severe obstructive sleep apnea with loud snoring, but  not associated with hypoxemia. Brady Tachycardia noted. Urgent return for CPAP titration or auto-titration needed. I placed both orders  Christella Scheuermann)    CD  PS given to [facility] to call patient

## 2017-05-19 NOTE — Telephone Encounter (Signed)
I called pt. I advised pt that Dr. Brett Fairy reviewed their sleep study results and found that pt does have severe obstructive sleep apnea with loud snoring but not associated with hypoxemia. Dr. Brett Fairy recommends that pt have a Cpap. I reviewed PAP compliance expectations with the pt. Pt is agreeable to starting a CPAP. I advised pt that an order will be sent to a DME, [facility], and [facility] will call the pt within about one week after they file with the pt's insurance. [facility] will show the pt how to use the machine, fit for masks, and troubleshoot the CPAP if needed. A follow up appt was made for insurance purposes with Dr. Brett Fairy on October 17 and 1:30pm. Pt verbalized understanding to arrive 15 minutes early and bring their CPAP. A letter with all of this information in it will be mailed to the pt as a reminder. I verified with the pt that the address we have on file is correct. Pt verbalized understanding of results. Pt had no questions at this time but was encouraged to call back if questions arise.

## 2017-05-22 ENCOUNTER — Telehealth: Payer: Self-pay | Admitting: Neurology

## 2017-05-26 NOTE — Telephone Encounter (Signed)
Error

## 2017-06-02 ENCOUNTER — Telehealth: Payer: Self-pay | Admitting: Neurology

## 2017-06-02 DIAGNOSIS — G4733 Obstructive sleep apnea (adult) (pediatric): Secondary | ICD-10-CM

## 2017-06-02 NOTE — Telephone Encounter (Signed)
[employer] denied CPAP approved auto pap

## 2017-06-04 NOTE — Telephone Encounter (Signed)
New order placed yesterday lowering the maximum pressure. Order fax'd to [facility]

## 2017-06-14 ENCOUNTER — Other Ambulatory Visit: Payer: Self-pay | Admitting: Internal Medicine

## 2017-06-14 DIAGNOSIS — I10 Essential (primary) hypertension: Secondary | ICD-10-CM

## 2017-07-25 ENCOUNTER — Telehealth: Payer: Self-pay | Admitting: *Deleted

## 2017-07-25 MED ORDER — VARENICLINE TARTRATE 1 MG PO TABS: 1.0000 mg | ORAL_TABLET | Freq: Two times a day (BID) | ORAL | 1 refills | Status: DC

## 2017-07-25 MED ORDER — VARENICLINE TARTRATE 0.5 MG X 11 & 1 MG X 42 PO MISC: ORAL | 0 refills | Status: DC

## 2017-07-25 NOTE — Telephone Encounter (Signed)
Send Chantix, one  Starter pack and one month refill. She is due for a visit in November. Will see how she's doing at that time. rec to quit tobacco 2 weeks after she starts Chantix. Call if side effects

## 2017-07-25 NOTE — Telephone Encounter (Signed)
Please advise 

## 2017-07-25 NOTE — Telephone Encounter (Signed)
Rx's faxed to [facility]. Informed Pt that Rx sent and recommendations. Pt will call back to schedule f/u for November.

## 2017-07-25 NOTE — Telephone Encounter (Signed)
Patient called and states he want the provider Dr. Larose Kells to send in a refill for Chantix to her pharmacy. Patient was on this medication 2 years ago. Patient pharmacy is [facility] in [place]. Patient is requesting a call back. Contact # is [phone]

## 2017-07-31 ENCOUNTER — Telehealth: Payer: Self-pay | Admitting: Internal Medicine

## 2017-07-31 NOTE — Telephone Encounter (Signed)
Pt says that she was prescribed Chant ix, pt says that it cost 520.00 she can not afford. Please advise.    CB: [phone]

## 2017-07-31 NOTE — Telephone Encounter (Signed)
We have discount coupons here- if she wants to pick up or she may be able to find them online.

## 2017-08-31 ENCOUNTER — Encounter: Payer: Self-pay | Admitting: Neurology

## 2017-09-03 ENCOUNTER — Ambulatory Visit (INDEPENDENT_AMBULATORY_CARE_PROVIDER_SITE_OTHER): Payer: Managed Care, Other (non HMO) | Admitting: Neurology

## 2017-09-03 ENCOUNTER — Encounter: Payer: Self-pay | Admitting: Neurology

## 2017-09-03 VITALS — BP 124/79 | HR 84 | Ht 67.0 in | Wt 246.0 lb

## 2017-09-03 DIAGNOSIS — J449 Chronic obstructive pulmonary disease, unspecified: Secondary | ICD-10-CM | POA: Diagnosis not present

## 2017-09-03 DIAGNOSIS — G4733 Obstructive sleep apnea (adult) (pediatric): Secondary | ICD-10-CM

## 2017-09-03 DIAGNOSIS — Z9989 Dependence on other enabling machines and devices: Secondary | ICD-10-CM

## 2017-09-03 MED ORDER — VARENICLINE TARTRATE 0.5 MG X 11 & 1 MG X 42 PO MISC: ORAL | 0 refills | Status: DC
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
## 2017-09-03 NOTE — Patient Instructions (Signed)

## 2018-01-12 ENCOUNTER — Other Ambulatory Visit: Payer: Self-pay | Admitting: Internal Medicine

## 2018-02-12 LAB — HM PAP SMEAR

## 2018-02-12 LAB — HM MAMMOGRAPHY

## 2018-02-13 ENCOUNTER — Other Ambulatory Visit: Payer: Self-pay | Admitting: Internal Medicine

## 2018-02-16 ENCOUNTER — Encounter: Payer: Managed Care, Other (non HMO) | Admitting: Internal Medicine

## 2018-03-02 ENCOUNTER — Telehealth: Payer: Self-pay | Admitting: Internal Medicine

## 2018-03-02 NOTE — Telephone Encounter (Signed)
Noted  

## 2018-03-02 NOTE — Telephone Encounter (Signed)
Pt has appt 03/17/2018- please advise.

## 2018-03-02 NOTE — Telephone Encounter (Signed)
Copied from [place] [phone]. Topic: Quick Communication - Rx Refill/Question >> Mar 02, 2018  1:25 PM Neva Seat wrote: Pt wants to know if Dr. Larose Kells can prescribe Bupropion SR 150 mg.  Christella Scheuermann does cover this Rx and said it was a non nicotine pill. [employer] doesn't cover Chantix Rx - very expensive.

## 2018-03-02 NOTE — Telephone Encounter (Signed)
Spoke w/ Pt- she doesn't want to take Wellbutrin or the generic equivalent as she has taken it before. She will discuss w/ PCP further at [place] on 03/17/2018.

## 2018-03-02 NOTE — Telephone Encounter (Signed)
Advised patient: I will start her on bupropion to help her stop tobacco. To take 1 tablet qd the first 10 days then increase to 1 tablet twice a day Stop smoking 2 weeks after she starts bupropion Like  any other antidepressant there is a risk of suicidal ideas, if she has any of that needs to stop the medication and seek for help or call the office. Keep follow-up in few weeks.

## 2018-03-09 ENCOUNTER — Encounter: Payer: Self-pay | Admitting: Internal Medicine

## 2018-03-17 ENCOUNTER — Encounter: Payer: Self-pay | Admitting: Internal Medicine

## 2018-03-17 ENCOUNTER — Ambulatory Visit (INDEPENDENT_AMBULATORY_CARE_PROVIDER_SITE_OTHER): Payer: Managed Care, Other (non HMO) | Admitting: Internal Medicine

## 2018-03-17 VITALS — BP 128/70 | HR 86 | Temp 98.5°F | Resp 14 | Ht 67.0 in | Wt 239.2 lb

## 2018-03-17 DIAGNOSIS — Z0000 Encounter for general adult medical examination without abnormal findings: Secondary | ICD-10-CM | POA: Diagnosis not present

## 2018-03-17 DIAGNOSIS — E1159 Type 2 diabetes mellitus with other circulatory complications: Secondary | ICD-10-CM | POA: Diagnosis not present

## 2018-03-17 DIAGNOSIS — Z23 Encounter for immunization: Secondary | ICD-10-CM | POA: Diagnosis not present

## 2018-03-17 MED ORDER — BUPROPION HCL 100 MG PO TABS: 100.0000 mg | ORAL_TABLET | Freq: Two times a day (BID) | ORAL | 0 refills | Status: DC

## 2018-03-17 NOTE — Patient Instructions (Signed)
GO TO THE LAB : Get the blood work     GO TO THE FRONT DESK Schedule your next appointment for a checkup in 3 months   Start Wellbutrin: 1 tablet daily for 1 week Then 1 tablet twice a day  Stop tobacco 2 weeks after you start Wellbutrin   Steps to Quit Smoking Smoking tobacco can be harmful to your health and can affect almost every organ in your body. Smoking puts you, and those around you, at risk for developing many serious chronic diseases. Quitting smoking is difficult, but it is one of the best things that you can do for your health. It is never too late to quit. What are the benefits of quitting smoking? When you quit smoking, you lower your risk of developing serious diseases and conditions, such as:  Lung cancer or lung disease, such as COPD.  Heart disease.  Stroke.  Heart attack.  Infertility.  Osteoporosis and bone fractures.  Additionally, symptoms such as coughing, wheezing, and shortness of breath may get better when you quit. You may also find that you get sick less often because your body is stronger at fighting off colds and infections. If you are pregnant, quitting smoking can help to reduce your chances of having a baby of low birth weight. How do I get ready to quit? When you decide to quit smoking, create a plan to make sure that you are successful. Before you quit:  Pick a date to quit. Set a date within the next two weeks to give you time to prepare.  Write down the reasons why you are quitting. Keep this list in places where you will see it often, such as on your bathroom mirror or in your car or wallet.  Identify the people, places, things, and activities that make you want to smoke (triggers) and avoid them. Make sure to take these actions: ? Throw away all cigarettes at home, at work, and in your car. ? Throw away smoking accessories, such as Scientist, research (medical). ? Clean your car and make sure to empty the ashtray. ? Clean your home, including  curtains and carpets.  Tell your family, friends, and coworkers that you are quitting. Support from your loved ones can make quitting easier.  Talk with your health care provider about your options for quitting smoking.  Find out what treatment options are covered by your health insurance.  What strategies can I use to quit smoking? Talk with your healthcare provider about different strategies to quit smoking. Some strategies include:  Quitting smoking altogether instead of gradually lessening how much you smoke over a period of time. Research shows that quitting "cold [place]" is more successful than gradually quitting.  Attending in-person counseling to help you build problem-solving skills. You are more likely to have success in quitting if you attend several counseling sessions. Even short sessions of 10 minutes can be effective.  Finding resources and support systems that can help you to quit smoking and remain smoke-free after you quit. These resources are most helpful when you use them often. They can include: ? Online chats with a Social worker. ? Telephone quitlines. ? Careers information officer. ? Support groups or group counseling. ? Text messaging programs. ? Mobile phone applications.  Taking medicines to help you quit smoking. (If you are pregnant or breastfeeding, talk with your health care provider first.) Some medicines contain nicotine and some do not. Both types of medicines help with cravings, but the medicines that include nicotine help  to relieve withdrawal symptoms. Your health care provider may recommend: ? Nicotine patches, gum, or lozenges. ? Nicotine inhalers or sprays. ? Non-nicotine medicine that is taken by mouth.  Talk with your health care provider about combining strategies, such as taking medicines while you are also receiving in-person counseling. Using these two strategies together makes you more likely to succeed in quitting than if you used either  strategy on its own. If you are pregnant or breastfeeding, talk with your health care provider about finding counseling or other support strategies to quit smoking. Do not take medicine to help you quit smoking unless told to do so by your health care provider. What things can I do to make it easier to quit? Quitting smoking might feel overwhelming at first, but there is a lot that you can do to make it easier. Take these important actions:  Reach out to your family and friends and ask that they support and encourage you during this time. Call telephone quitlines, reach out to support groups, or work with a counselor for support.  Ask people who smoke to avoid smoking around you.  Avoid places that trigger you to smoke, such as bars, parties, or smoke-break areas at work.  Spend time around people who do not smoke.  Lessen stress in your life, because stress can be a smoking trigger for some people. To lessen stress, try: ? Exercising regularly. ? Deep-breathing exercises. ? Yoga. ? Meditating. ? Performing a body scan. This involves closing your eyes, scanning your body from head to toe, and noticing which parts of your body are particularly tense. Purposefully relax the muscles in those areas.  Download or purchase mobile phone or tablet apps (applications) that can help you stick to your quit plan by providing reminders, tips, and encouragement. There are many free apps, such as QuitGuide from the [employer] Office manager for Disease Control and Prevention). You can find other support for quitting smoking (smoking cessation) through smokefree.gov and other websites.  How will I feel when I quit smoking? Within the first 24 hours of quitting smoking, you may start to feel some withdrawal symptoms. These symptoms are usually most noticeable 2-3 days after quitting, but they usually do not last beyond 2-3 weeks. Changes or symptoms that you might experience include:  Mood swings.  Restlessness,  anxiety, or irritation.  Difficulty concentrating.  Dizziness.  Strong cravings for sugary foods in addition to nicotine.  Mild weight gain.  Constipation.  Nausea.  Coughing or a sore throat.  Changes in how your medicines work in your body.  A depressed mood.  Difficulty sleeping (insomnia).  After the first 2-3 weeks of quitting, you may start to notice more positive results, such as:  Improved sense of smell and taste.  Decreased coughing and sore throat.  Slower heart rate.  Lower blood pressure.  Clearer skin.  The ability to breathe more easily.  Fewer sick days.  Quitting smoking is very challenging for most people. Do not get discouraged if you are not successful the first time. Some people need to make many attempts to quit before they achieve long-term success. Do your best to stick to your quit plan, and talk with your health care provider if you have any questions or concerns. This information is not intended to replace advice given to you by your health care provider. Make sure you discuss any questions you have with your health care provider. Document Released: 10/29/2001 Document Revised: 07/02/2016 Document Reviewed: 03/21/2015 Elsevier Interactive Patient  Education  2018 Elsevier Inc.  

## 2018-03-17 NOTE — Progress Notes (Signed)
Pre visit review using our clinic review tool, if applicable. No additional management support is needed unless otherwise documented below in the visit note. 

## 2018-03-17 NOTE — Assessment & Plan Note (Addendum)
-  Td 2008 ;   pnm shot -- declined again  Female care:  Used to see Dr Courteney Gust, now sees Dr Ileene Patrick in [facility]) CCS:  Cscope in the past d/t iron def ( 2008 ), had one colon polyp, Dr Gilford Rile in [place] 5-13, Dr Shana Chute , polyp, tics. Then had a cscope was 02-2017, next per GI Tobacco use ready to quit, unable to afford Chantix, previously Wellbutrin did not help much.  Recommend try again with Wellbutrin.  See prescription. --diet, exercise: Counseled  --Labs: CMP, FLP, CBC, A1c, micro

## 2018-03-17 NOTE — Progress Notes (Signed)
Subjective:    Patient ID: Wanda Ortega, female    DOB: [birth year], 57 y.o.   MRN: [id number]  DOS:  03/17/2018 Type of visit - description : CPX Interval history: Has few concerns. Wants to stop smoking. Developed chest congestion and cough for the last 3 days. No fever chills + Runny nose and sneezing Very small amount of sputum production, no wheezing.   Review of Systems   Other than above, a 14 point review of systems is negative    Past Medical History:  Diagnosis Date  . Anemia    EGD and Cscope 2008; Novasure 2010 for excessive vag bleed  . Breast cancer ([place]) 05/21/2011  . Colon polyps   . Contraception    husband's vasectomy  . Depression   . Diabetes mellitus without complication ([place])   . Dyslipidemia   . GERD (gastroesophageal reflux disease) 2016   Dr. Shana Chute  . Hypertension   . Migraines   . OSA on CPAP     Past Surgical History:  Procedure Laterality Date  . BREAST LUMPECTOMY Left July 2012   . CHOLECYSTECTOMY    . [place]  01/2009    Social History   Socioeconomic History  . Marital status: Married    Spouse name: Not on file  . Number of children: 1  . Years of education: Not on file  . Highest education level: Not on file  Occupational History  . Occupation: not working at present   Social Needs  . Financial resource strain: Not on file  . Food insecurity:    Worry: Not on file    Inability: Not on file  . Transportation needs:    Medical: Not on file    Non-medical: Not on file  Tobacco Use  . Smoking status: Current Every Day Smoker    Packs/day: 0.50    Types: Cigarettes  . Smokeless tobacco: Never Used  . Tobacco comment: > 0.5 PPD  Substance and Sexual Activity  . Alcohol use: No    Alcohol/week: 0.0 oz  . Drug use: No  . Sexual activity: Yes    Birth control/protection: Surgical    Comment: husband had vasectomy   Lifestyle  . Physical activity:    Days per week: Not on file    Minutes per session:  Not on file  . Stress: Not on file  Relationships  . Social connections:    Talks on phone: Not on file    Gets together: Not on file    Attends religious service: Not on file    Active member of club or organization: Not on file    Attends meetings of clubs or organizations: Not on file    Relationship status: Not on file  . Intimate partner violence:    Fear of current or ex partner: Not on file    Emotionally abused: Not on file    Physically abused: Not on file    Forced sexual activity: Not on file  Other Topics Concern  . Not on file  Social History Narrative   birth control, husband had a vasectomy   1 daughter, 2 g-kids      Family History  Problem Relation Age of Onset  . Coronary artery disease Father        several MIs, CABG  . Hypertension Father   . Diabetes Father   . Parkinsonism Father   . Diabetes Sister   . Stroke Unknown  GM  . Colon cancer Paternal Grandfather   . Stroke Sister   . Breast cancer Other        G aunt  . Lung cancer Other      Allergies as of 03/17/2018   No Known Allergies     Medication List        Accurate as of 03/17/18 11:59 PM. Always use your most recent med list.          buPROPion 100 MG tablet Commonly known as:  WELLBUTRIN Take 1 tablet (100 mg total) by mouth 2 (two) times daily.   carvedilol 25 MG tablet Commonly known as:  COREG Take 1 tablet (25 mg total) by mouth 2 (two) times daily with a meal.   cloNIDine 0.1 MG tablet Commonly known as:  CATAPRES Take 1 tablet (0.1 mg total) by mouth 2 (two) times daily.   felodipine 5 MG 24 hr tablet Commonly known as:  PLENDIL Take 1 tablet (5 mg total) by mouth daily.   omeprazole 20 MG capsule Commonly known as:  PRILOSEC Take 1 capsule (20 mg total) by mouth daily.   simvastatin 40 MG tablet Commonly known as:  ZOCOR Take 1 tablet (40 mg total) by mouth every evening.          Objective:   Physical Exam BP 128/70 (BP Location: Right Arm,  Patient Position: Sitting, Cuff Size: Normal)   Pulse 86   Temp 98.5 F (36.9 C) (Oral)   Resp 14   Ht 5\' 7"  (1.702 m)   Wt 239 lb 4 oz (108.5 kg)   SpO2 96%   BMI 37.47 kg/m  General:   Well developed, well nourished . NAD.  Neck: No  thyromegaly  HEENT:  Normocephalic . Face symmetric, atraumatic TMs normal, throat symmetric, nose not congested Lungs:  CTA B Normal respiratory effort, no intercostal retractions, no accessory muscle use. Heart: RRR,  no murmur.  No pretibial edema bilaterally  Abdomen:  Not distended, soft, non-tender. No rebound or rigidity.   Skin: Exposed areas without rash. Not pale. Not jaundice Neurologic:  alert & oriented X3.  Speech normal, gait appropriate for age and unassisted Strength symmetric and appropriate for age.  Psych: Cognition and judgment appear intact.  Cooperative with normal attention span and concentration.  Behavior appropriate. No anxious or depressed appearing.     Assessment & Plan:   Assessment  DM HTN Dyslipidemia Depression - declined SSRIs before  OSA severe per home study 04/2017 Breast cancer ---dx 2012, left lumpectomy Smoker GERD: EGD 02-2017: Pathology negative for H. Pylori, esophagus  was a stretched Migraines  Bladder spasms --sees gyn Contraception husband's vasectomy +FH: CAD (father), DM H/o Anemia --- EGD colonoscopy 2008; NovaSure 2010 for excessive bleeding H/O Acute DVT -R- dx 09-2015, on xarelto x 6 months, saw hem-onc, due to Tamoxifen   PLAN DM: On no medications, check a A1c and micro HTN: Seems controlled on carvedilol, clonidine, blending. Dyslipidemia: On simvastatin, checking labs OSA: Using a CPAP, overall she feels somewhat more energetic, less migraines. RTC 3 months

## 2018-03-18 LAB — COMPREHENSIVE METABOLIC PANEL
ALT: 34 U/L (ref 0–35)
AST: 41 U/L — AB (ref 0–37)
Albumin: 3.9 g/dL (ref 3.5–5.2)
Alkaline Phosphatase: 49 U/L (ref 39–117)
BILIRUBIN TOTAL: 0.2 mg/dL (ref 0.2–1.2)
BUN: 6 mg/dL (ref 6–23)
CALCIUM: 8.6 mg/dL (ref 8.4–10.5)
CO2: 23 meq/L (ref 19–32)
CREATININE: 0.77 mg/dL (ref 0.40–1.20)
Chloride: 104 mEq/L (ref 96–112)
GFR: 82.84 mL/min (ref >60.00)
Glucose, Bld: 142 mg/dL — ABNORMAL HIGH (ref 70–99)
Potassium: 3.7 mEq/L (ref 3.5–5.1)
Sodium: 138 mEq/L (ref 135–145)
Total Protein: 7.2 g/dL (ref 6.0–8.3)

## 2018-03-18 LAB — CBC WITH DIFFERENTIAL/PLATELET
Basophils Absolute: 0.1 10*3/uL (ref 0.0–0.1)
Basophils Relative: 0.8 % (ref 0.0–3.0)
EOS PCT: 3.5 % (ref 0.0–5.0)
Eosinophils Absolute: 0.3 10*3/uL (ref 0.0–0.7)
HEMATOCRIT: 41 % (ref 36.0–46.0)
HEMOGLOBIN: 13.2 g/dL (ref 12.0–15.0)
LYMPHS ABS: 1.6 10*3/uL (ref 0.7–4.0)
LYMPHS PCT: 18.5 % (ref 12.0–46.0)
MCHC: 32.2 g/dL (ref 30.0–36.0)
MCV: 83.4 fl (ref 78.0–100.0)
MONOS PCT: 8.8 % (ref 3.0–12.0)
Monocytes Absolute: 0.8 10*3/uL (ref 0.1–1.0)
Neutro Abs: 5.9 10*3/uL (ref 1.4–7.7)
Neutrophils Relative %: 68.4 % (ref 43.0–77.0)
Platelets: 257 10*3/uL (ref 150.0–400.0)
RBC: 4.91 Mil/uL (ref 3.87–5.11)
RDW: 15.3 % (ref 11.5–15.5)
WBC: 8.7 10*3/uL (ref 4.0–10.5)

## 2018-03-18 LAB — MICROALBUMIN / CREATININE URINE RATIO
Creatinine,U: 263.1 mg/dL
MICROALB/CREAT RATIO: 1.2 mg/g (ref 0.0–30.0)
Microalb, Ur: 3.1 mg/dL — ABNORMAL HIGH (ref 0.0–1.9)

## 2018-03-18 LAB — LIPID PANEL
Cholesterol: 166 mg/dL (ref 0–200)
HDL: 33 mg/dL — ABNORMAL LOW (ref >39.00)
NONHDL: 133.42
TRIGLYCERIDES: 300 mg/dL — AB (ref 0.0–149.0)
Total CHOL/HDL Ratio: 5
VLDL: 60 mg/dL — ABNORMAL HIGH (ref 0.0–40.0)

## 2018-03-18 LAB — HEMOGLOBIN A1C: Hgb A1c MFr Bld: 9.8 % — ABNORMAL HIGH (ref 4.6–6.5)

## 2018-03-18 LAB — LDL CHOLESTEROL, DIRECT: LDL DIRECT: 110 mg/dL

## 2018-03-18 NOTE — Assessment & Plan Note (Signed)
DM: On no medications, check a A1c and micro HTN: Seems controlled on carvedilol, clonidine, blending. Dyslipidemia: On simvastatin, checking labs OSA: Using a CPAP, overall she feels somewhat more energetic, less migraines. RTC 3 months

## 2018-03-26 ENCOUNTER — Other Ambulatory Visit: Payer: Self-pay | Admitting: Internal Medicine

## 2018-03-26 DIAGNOSIS — I10 Essential (primary) hypertension: Secondary | ICD-10-CM

## 2018-03-30 ENCOUNTER — Encounter: Payer: Self-pay | Admitting: Internal Medicine

## 2018-04-01 ENCOUNTER — Telehealth: Payer: Self-pay

## 2018-04-01 NOTE — Telephone Encounter (Signed)
Please advise 

## 2018-04-01 NOTE — Telephone Encounter (Signed)
Copied from [place] [phone]. Topic: Quick Communication - Rx Refill/Question >> Mar 02, 2018  1:25 PM Neva Seat wrote: Pt wants to know if Dr. Larose Kells can prescribe Bupropion SR 150 mg.  Christella Scheuermann does cover this Rx and said it was a non nicotine pill. [employer] doesn't cover Chantix Rx - very expensive.  >> Apr 01, 2018  3:00 PM Stovall, Edwena Blow A wrote: Pt called in said that cigna covers the Bupropion SR 150 mg,  She stated she was to call back and let Dr Larose Kells know what her ins would cover

## 2018-04-04 NOTE — Telephone Encounter (Signed)
Switch to Bupropion SR 150 mg: 1 po qd x 1 week, then 1 po bid #180

## 2018-04-06 MED ORDER — BUPROPION HCL ER (SR) 150 MG PO TB12: ORAL_TABLET | ORAL | 0 refills | Status: DC

## 2018-04-06 NOTE — Telephone Encounter (Signed)
Rx sent 

## 2018-04-08 ENCOUNTER — Encounter: Payer: Self-pay | Admitting: Internal Medicine

## 2018-04-21 ENCOUNTER — Encounter: Payer: Self-pay | Admitting: Internal Medicine

## 2018-05-17 ENCOUNTER — Other Ambulatory Visit: Payer: Self-pay | Admitting: Internal Medicine

## 2018-06-17 ENCOUNTER — Ambulatory Visit: Payer: Managed Care, Other (non HMO) | Admitting: Internal Medicine

## 2018-06-17 ENCOUNTER — Encounter: Payer: Self-pay | Admitting: Internal Medicine

## 2018-06-17 VITALS — BP 122/82 | HR 87 | Temp 98.1°F | Resp 16 | Ht 67.0 in | Wt 234.5 lb

## 2018-06-17 DIAGNOSIS — Z09 Encounter for follow-up examination after completed treatment for conditions other than malignant neoplasm: Secondary | ICD-10-CM

## 2018-06-17 DIAGNOSIS — Z122 Encounter for screening for malignant neoplasm of respiratory organs: Secondary | ICD-10-CM

## 2018-06-17 DIAGNOSIS — E119 Type 2 diabetes mellitus without complications: Secondary | ICD-10-CM

## 2018-06-17 DIAGNOSIS — F17200 Nicotine dependence, unspecified, uncomplicated: Secondary | ICD-10-CM | POA: Diagnosis not present

## 2018-06-17 DIAGNOSIS — D72829 Elevated white blood cell count, unspecified: Secondary | ICD-10-CM | POA: Diagnosis not present

## 2018-06-17 DIAGNOSIS — Z801 Family history of malignant neoplasm of trachea, bronchus and lung: Secondary | ICD-10-CM

## 2018-06-17 DIAGNOSIS — R8271 Bacteriuria: Secondary | ICD-10-CM

## 2018-06-17 NOTE — Progress Notes (Signed)
Subjective:    Patient ID: Wanda Ortega, female    DOB: [birth year], 57 y.o.   MRN: [id number]  DOS:  06/17/2018 Type of visit - description : Follow-up Interval history: Tobacco abuse: Tried bupropion, 1 tablet daily was okay but 2 tablets a day because dizziness, weakness.  Meds did not curtail her desire to tobacco.  She stopped. DM: Last A1c elevated, was recommended metformin, did not proceed. Concern about lung cancer, request screening. Few days ago, CBC show elevated white count at her oncologist, was recommended to discuss with me.  See review of systems   Review of Systems No fever chills She does have inflammation cyst near the vaginal area and plans to see her gynecologist.  No discharge. No sinus pain, congestion or nasal discharge No nausea or vomiting No cough, sputum production or hemoptysis.   Past Medical History:  Diagnosis Date  . Anemia    EGD and Cscope 2008; Novasure 2010 for excessive vag bleed  . Breast cancer ([place]) 05/21/2011  . Colon polyps   . Contraception    husband's vasectomy  . Depression   . Diabetes mellitus without complication ([place])   . Dyslipidemia   . GERD (gastroesophageal reflux disease) 2016   Dr. Shana Chute  . Hypertension   . Migraines   . OSA on CPAP     Past Surgical History:  Procedure Laterality Date  . BREAST LUMPECTOMY Left July 2012   . CHOLECYSTECTOMY    . [place]  01/2009    Social History   Socioeconomic History  . Marital status: Married    Spouse name: Not on file  . Number of children: 1  . Years of education: Not on file  . Highest education level: Not on file  Occupational History  . Occupation: not working at present   Social Needs  . Financial resource strain: Not on file  . Food insecurity:    Worry: Not on file    Inability: Not on file  . Transportation needs:    Medical: Not on file    Non-medical: Not on file  Tobacco Use  . Smoking status: Current Every Day Smoker   Packs/day: 0.50    Types: Cigarettes  . Smokeless tobacco: Never Used  . Tobacco comment: > 0.5 to 1 PPD x 40 year  Substance and Sexual Activity  . Alcohol use: No    Alcohol/week: 0.0 oz  . Drug use: No  . Sexual activity: Yes    Birth control/protection: Surgical    Comment: husband had vasectomy   Lifestyle  . Physical activity:    Days per week: Not on file    Minutes per session: Not on file  . Stress: Not on file  Relationships  . Social connections:    Talks on phone: Not on file    Gets together: Not on file    Attends religious service: Not on file    Active member of club or organization: Not on file    Attends meetings of clubs or organizations: Not on file    Relationship status: Not on file  . Intimate partner violence:    Fear of current or ex partner: Not on file    Emotionally abused: Not on file    Physically abused: Not on file    Forced sexual activity: Not on file  Other Topics Concern  . Not on file  Social History Narrative   birth control, husband had a vasectomy   1 daughter, 2  g-kids       Allergies as of 06/17/2018   No Known Allergies     Medication List        Accurate as of 06/17/18 11:59 PM. Always use your most recent med list.          carvedilol 25 MG tablet Commonly known as:  COREG Take 1 tablet (25 mg total) by mouth 2 (two) times daily with a meal.   cloNIDine 0.1 MG tablet Commonly known as:  CATAPRES Take 1 tablet (0.1 mg total) by mouth 2 (two) times daily.   felodipine 5 MG 24 hr tablet Commonly known as:  PLENDIL Take 1 tablet (5 mg total) by mouth daily.   omeprazole 20 MG capsule Commonly known as:  PRILOSEC Take 1 capsule (20 mg total) by mouth daily.   simvastatin 40 MG tablet Commonly known as:  ZOCOR Take 1 tablet (40 mg total) by mouth every evening.          Objective:   Physical Exam BP 122/82 (BP Location: Right Arm, Patient Position: Sitting, Cuff Size: Normal)   Pulse 87   Temp 98.1 F  (36.7 C) (Oral)   Resp 16   Ht 5\' 7"  (1.702 m)   Wt 234 lb 8 oz (106.4 kg)   SpO2 96%   BMI 36.73 kg/m  General:   Well developed, NAD, see BMI.  HEENT:  Normocephalic . Face symmetric, atraumatic Lungs:  CTA B Normal respiratory effort, no intercostal retractions, no accessory muscle use. Heart: RRR,  no murmur.  no pretibial edema bilaterally  Abdomen:  Not distended, soft, non-tender. No rebound or rigidity.   Skin: Not pale. Not jaundice Neurologic:  alert & oriented X3.  Speech normal, gait appropriate for age and unassisted Psych--  Cognition and judgment appear intact.  Cooperative with normal attention span and concentration.  Behavior appropriate. No anxious or depressed appearing.     Assessment & Plan    Assessment  DM HTN Dyslipidemia Depression - declined SSRIs before  OSA severe per home study 04/2017 Breast cancer ---dx 2012, left lumpectomy Smoker GERD: EGD 02-2017: Pathology negative for H. Pylori, esophagus  was a stretched Migraines  Bladder spasms --sees gyn Contraception husband's vasectomy +FH: CAD (father), DM H/o Anemia --- EGD colonoscopy 2008; NovaSure 2010 for excessive bleeding H/O Acute DVT -R- dx 09-2015, on xarelto x 6 months, saw hem-onc, due to Tamoxifen   PLAN DM: Admits to have very poor diet.  Last A1c 9.8, he reports some polydipsia.  Had a long discussion about diet, encouraged to start making some changes.  We will recheck a A1c, BMP.  Based on results we will prescribe metformin.  She will need a second agent based on her last A1c.  We talk about injectables and tablets and she prefers tablets.  I think a SGLT 2 inhibitor would be ideal.  No contraindications. Tobacco: Intolerant to Wellbutrin.  For now we will focus on diabetes treatment. Lung cancer screening: Patient is a heavy smoker, her aunt passed from lung cancer last year, request a screening, will set up Elevated white count: Found on the oncologist few days ago,  review of systems negative except for what seems to be a inflammated cyst near the vaginal area, plans to see gynecology, recommend to do ASAP.  Recheck CBC, UA. RTC 3 months

## 2018-06-17 NOTE — Patient Instructions (Signed)
GO TO THE LAB : Get the blood work     GO TO THE FRONT DESK Schedule your next appointment for a follow-up in 3 months

## 2018-06-17 NOTE — Progress Notes (Signed)
Pre visit review using our clinic review tool, if applicable. No additional management support is needed unless otherwise documented below in the visit note. 

## 2018-06-18 LAB — URINALYSIS, ROUTINE W REFLEX MICROSCOPIC
BILIRUBIN URINE: NEGATIVE
Hgb urine dipstick: NEGATIVE
Ketones, ur: NEGATIVE
LEUKOCYTES UA: NEGATIVE
Nitrite: NEGATIVE
PH: 5.5 (ref 5.0–8.0)
RBC / HPF: NONE SEEN (ref 0–?)
Specific Gravity, Urine: >=1.03 — AB (ref 1.000–1.030)
TOTAL PROTEIN, URINE-UPE24: NEGATIVE
URINE GLUCOSE: NEGATIVE
UROBILINOGEN UA: 0.2 (ref 0.0–1.0)

## 2018-06-18 LAB — CBC WITH DIFFERENTIAL/PLATELET
BASOS ABS: 0.1 10*3/uL (ref 0.0–0.1)
Basophils Relative: 1.2 % (ref 0.0–3.0)
EOS ABS: 0.4 10*3/uL (ref 0.0–0.7)
EOS PCT: 3.8 % (ref 0.0–5.0)
HCT: 40.1 % (ref 36.0–46.0)
HEMOGLOBIN: 13.1 g/dL (ref 12.0–15.0)
LYMPHS ABS: 4.7 10*3/uL — AB (ref 0.7–4.0)
Lymphocytes Relative: 43.4 % (ref 12.0–46.0)
MCHC: 32.5 g/dL (ref 30.0–36.0)
MCV: 82.3 fl (ref 78.0–100.0)
MONO ABS: 0.6 10*3/uL (ref 0.1–1.0)
Monocytes Relative: 5.9 % (ref 3.0–12.0)
NEUTROS PCT: 45.7 % (ref 43.0–77.0)
Neutro Abs: 5 10*3/uL (ref 1.4–7.7)
Platelets: 330 10*3/uL (ref 150.0–400.0)
RBC: 4.87 Mil/uL (ref 3.87–5.11)
RDW: 15.1 % (ref 11.5–15.5)
WBC: 10.9 10*3/uL — AB (ref 4.0–10.5)

## 2018-06-18 LAB — BASIC METABOLIC PANEL
BUN: 12 mg/dL (ref 6–23)
CALCIUM: 9.3 mg/dL (ref 8.4–10.5)
CHLORIDE: 103 meq/L (ref 96–112)
CO2: 26 mEq/L (ref 19–32)
Creatinine, Ser: 0.74 mg/dL (ref 0.40–1.20)
GFR: 86.65 mL/min (ref >60.00)
Glucose, Bld: 131 mg/dL — ABNORMAL HIGH (ref 70–99)
Potassium: 4.6 mEq/L (ref 3.5–5.1)
Sodium: 140 mEq/L (ref 135–145)

## 2018-06-18 LAB — HEMOGLOBIN A1C: HEMOGLOBIN A1C: 8.8 % — AB (ref 4.6–6.5)

## 2018-06-18 NOTE — Assessment & Plan Note (Signed)
DM: Admits to have very poor diet.  Last A1c 9.8, he reports some polydipsia.  Had a long discussion about diet, encouraged to start making some changes.  We will recheck a A1c, BMP.  Based on results we will prescribe metformin.  She will need a second agent based on her last A1c.  We talk about injectables and tablets and she prefers tablets.  I think a SGLT 2 inhibitor would be ideal.  No contraindications. Tobacco: Intolerant to Wellbutrin.  For now we will focus on diabetes treatment. Lung cancer screening: Patient is a heavy smoker, her aunt passed from lung cancer last year, request a screening, will set up Elevated white count: Found on the oncologist few days ago, review of systems negative except for what seems to be a inflammated cyst near the vaginal area, plans to see gynecology, recommend to do ASAP.  Recheck CBC, UA. RTC 3 months

## 2018-06-24 MED ORDER — METFORMIN HCL 1000 MG PO TABS: 1000.0000 mg | ORAL_TABLET | Freq: Two times a day (BID) | ORAL | 3 refills | Status: DC

## 2018-06-24 NOTE — Addendum Note (Signed)
Addended byDamita Dunnings D on: 06/24/2018 09:04 AM   Modules accepted: Orders

## 2018-07-03 ENCOUNTER — Other Ambulatory Visit (INDEPENDENT_AMBULATORY_CARE_PROVIDER_SITE_OTHER): Payer: Managed Care, Other (non HMO)

## 2018-07-03 DIAGNOSIS — R8271 Bacteriuria: Secondary | ICD-10-CM | POA: Diagnosis not present

## 2018-07-03 NOTE — Addendum Note (Signed)
Addended by: Caffie Pinto on: 07/03/2018 02:08 PM   Modules accepted: Orders

## 2018-07-04 LAB — URINALYSIS, ROUTINE W REFLEX MICROSCOPIC
Bilirubin Urine: NEGATIVE
GLUCOSE, UA: NEGATIVE
Hgb urine dipstick: NEGATIVE
Ketones, ur: NEGATIVE
LEUKOCYTES UA: NEGATIVE
NITRITE: NEGATIVE
Protein, ur: NEGATIVE
SPECIFIC GRAVITY, URINE: 1.024 (ref 1.001–1.03)
pH: <=5 (ref 5.0–8.0)

## 2018-07-04 LAB — URINE CULTURE
MICRO NUMBER:: 90977082
SPECIMEN QUALITY: ADEQUATE

## 2018-07-08 ENCOUNTER — Encounter: Payer: Self-pay | Admitting: Internal Medicine

## 2018-07-15 ENCOUNTER — Encounter: Payer: Self-pay | Admitting: Internal Medicine

## 2018-07-15 ENCOUNTER — Telehealth: Payer: Self-pay | Admitting: Internal Medicine

## 2018-07-15 NOTE — Telephone Encounter (Signed)
Her lung cancer screening CT was denied due to her age, she is 38. Otherwise she meets criteria. Please withdrawal the request for CT Let the patient know that I will reorder the CT after her upcoming [birth year].  Once she is 57 she will be able to proceed with the CT.

## 2018-08-19 ENCOUNTER — Other Ambulatory Visit: Payer: Self-pay

## 2018-08-19 DIAGNOSIS — Z122 Encounter for screening for malignant neoplasm of respiratory organs: Secondary | ICD-10-CM

## 2018-08-19 DIAGNOSIS — F17200 Nicotine dependence, unspecified, uncomplicated: Secondary | ICD-10-CM

## 2018-08-26 ENCOUNTER — Ambulatory Visit (HOSPITAL_BASED_OUTPATIENT_CLINIC_OR_DEPARTMENT_OTHER)
Admission: RE | Admit: 2018-08-26 | Discharge: 2018-08-26 | Disposition: A | Discharge status: Home / Self Care | Payer: Managed Care, Other (non HMO) | Source: Ambulatory Visit | Attending: Internal Medicine | Admitting: Internal Medicine

## 2018-08-26 DIAGNOSIS — I700 Atherosclerosis of aorta: Secondary | ICD-10-CM | POA: Diagnosis not present

## 2018-08-26 DIAGNOSIS — Z122 Encounter for screening for malignant neoplasm of respiratory organs: Secondary | ICD-10-CM | POA: Insufficient documentation

## 2018-08-26 DIAGNOSIS — J432 Centrilobular emphysema: Secondary | ICD-10-CM | POA: Insufficient documentation

## 2018-08-26 DIAGNOSIS — J8410 Pulmonary fibrosis, unspecified: Secondary | ICD-10-CM | POA: Insufficient documentation

## 2018-08-26 DIAGNOSIS — I2510 Atherosclerotic heart disease of native coronary artery without angina pectoris: Secondary | ICD-10-CM | POA: Diagnosis not present

## 2018-08-26 DIAGNOSIS — F17210 Nicotine dependence, cigarettes, uncomplicated: Secondary | ICD-10-CM | POA: Insufficient documentation

## 2018-08-26 DIAGNOSIS — J984 Other disorders of lung: Secondary | ICD-10-CM | POA: Diagnosis not present

## 2018-08-26 DIAGNOSIS — F17200 Nicotine dependence, unspecified, uncomplicated: Secondary | ICD-10-CM

## 2018-09-03 ENCOUNTER — Ambulatory Visit: Payer: Managed Care, Other (non HMO) | Admitting: Neurology

## 2018-09-09 ENCOUNTER — Ambulatory Visit: Payer: Managed Care, Other (non HMO) | Admitting: Family Medicine

## 2018-09-09 ENCOUNTER — Encounter: Payer: Self-pay | Admitting: Family Medicine

## 2018-09-09 VITALS — BP 128/80 | HR 79 | Temp 98.0°F | Resp 16 | Ht 67.0 in | Wt 234.0 lb

## 2018-09-09 DIAGNOSIS — M549 Dorsalgia, unspecified: Secondary | ICD-10-CM | POA: Diagnosis not present

## 2018-09-09 DIAGNOSIS — E1159 Type 2 diabetes mellitus with other circulatory complications: Secondary | ICD-10-CM | POA: Diagnosis not present

## 2018-09-09 DIAGNOSIS — R81 Glycosuria: Secondary | ICD-10-CM | POA: Diagnosis not present

## 2018-09-09 DIAGNOSIS — S29012A Strain of muscle and tendon of back wall of thorax, initial encounter: Secondary | ICD-10-CM | POA: Diagnosis not present

## 2018-09-09 LAB — POCT URINALYSIS DIP (MANUAL ENTRY)
Blood, UA: NEGATIVE
LEUKOCYTES UA: NEGATIVE
Nitrite, UA: NEGATIVE
Urobilinogen, UA: 0.2 E.U./dL
pH, UA: 6 (ref 5.0–8.0)

## 2018-09-09 LAB — GLUCOSE, POCT (MANUAL RESULT ENTRY): POC Glucose: 229 mg/dl — AB (ref 70–99)

## 2018-09-09 MED ORDER — CYCLOBENZAPRINE HCL 10 MG PO TABS: 10.0000 mg | ORAL_TABLET | Freq: Two times a day (BID) | ORAL | 0 refills | Status: DC | PRN

## 2018-09-09 MED ORDER — GLIPIZIDE 5 MG PO TABS: 5.0000 mg | ORAL_TABLET | Freq: Every day | ORAL | 3 refills | Status: DC

## 2018-09-09 NOTE — Progress Notes (Signed)
[place] at [facility] [address] [place], [place] [address] [phone]  Date:  09/09/2018   Name:  Wanda Ortega   DOB:  04/27/1961   MRN:  [id number]  PCP:  Colon Branch, MD    Chief Complaint: Back Pain (since saturday, mid back radiating up, no urinary issues, twisted back opening door)   History of Present Illness:  Wanda Ortega is a 57 y.o. very pleasant female patient who presents with the following:  Pt of Dr. Larose Kells, history of DM, hyperlipidemia, HTN Here today with back pain  Today is Wednesday On Saturday he was putting something light into the pantry She leaned over and felt a tweak in her back.  It did not seem like it was much, but it got worse as the day went on.  She eventually developed pain across her bilateral thoracic back  She is using heat and doing some stretches It has stayed about the same for the last few days The pain is staying in her back- no radiation to her legs She had bent over the bed the other day and could not get up easily- seemed to get stuck in this position  Otherwise no numbness or weakness of her legs No change in her bowel or bladder function No hematuria   She has no gallbladder and is on metformin which causes loose stools- however this is not a change She did have a CT scan recently for lung cancer screening which showed degenerative change in her spine but OW ok   Lab Results  Component Value Date   HGBA1C 8.8 (H) 06/17/2018   She is taking 500 mg of metformin once a day - Dr. Larose Kells had wanted her to titrate up to a higher dose.  On discussion she really cannot tolerate metformin and would like to change to a different med.  Will have her change to glipizide.  She will see Dr. Caro Laroche soon to discuss any other needed treatment   Patient Active Problem List   Diagnosis Date Noted  . PCP NOTES >>>>>>>>>>>>>>>>> 08/10/2015  . GERD (gastroesophageal reflux disease) 06/30/2015   . Pain in ankle joint 10/31/2014  . Tenosynovitis of ankle 10/31/2014  . Acute DVT -R-  dx 09-2015, on xarelto x 6 months, saw hem-onc 10/17/2014  . DMII (diabetes mellitus, type 2) ([place]) 10/08/2013  . Annual physical exam 02/24/2012  . Breast cancer ([place]) 05/21/2011  . OBESITY 01/04/2008  . NICOTINE ADDICTION 12/02/2007  . DEPRESSION 12/02/2007  . COLONIC POLYPS 06/01/2007  . Hyperlipidemia 06/01/2007  . MIGRAINE HEADACHE 04/09/2007  . Essential hypertension 04/09/2007    Past Medical History:  Diagnosis Date  . Anemia    EGD and Cscope 2008; Novasure 2010 for excessive vag bleed  . Breast cancer ([place]) 05/21/2011  . Colon polyps   . Contraception    husband's vasectomy  . Depression   . Diabetes mellitus without complication ([place])   . Dyslipidemia   . GERD (gastroesophageal reflux disease) 2016   Dr. Shana Chute  . Hypertension   . Migraines   . OSA on CPAP     Past Surgical History:  Procedure Laterality Date  . BREAST LUMPECTOMY Left July 2012   . CHOLECYSTECTOMY    . NOVASURE ABLATION  01/2009    Social History   Tobacco Use  . Smoking status: Current Every Day Smoker    Packs/day: 0.50    Types: Cigarettes  .  Smokeless tobacco: Never Used  . Tobacco comment: > 0.5 to 1 PPD x 40 year  Substance Use Topics  . Alcohol use: No    Alcohol/week: 0.0 standard drinks  . Drug use: No    Family History  Problem Relation Age of Onset  . Coronary artery disease Father        several MIs, CABG  . Hypertension Father   . Diabetes Father   . Parkinsonism Father   . Diabetes Sister   . Stroke Unknown        GM  . Colon cancer Paternal Grandfather   . Stroke Sister   . Breast cancer Other        G aunt  . Lung cancer Other     No Known Allergies  Medication list has been reviewed and updated.  Current Outpatient Medications on File Prior to Visit  Medication Sig Dispense Refill  . carvedilol (COREG) 25 MG tablet Take 1 tablet (25 mg total) by mouth 2 (two)  times daily with a meal. 180 tablet 1  . cloNIDine (CATAPRES) 0.1 MG tablet Take 1 tablet (0.1 mg total) by mouth 2 (two) times daily. 180 tablet 1  . felodipine (PLENDIL) 5 MG 24 hr tablet Take 1 tablet (5 mg total) by mouth daily. 90 tablet 3  . omeprazole (PRILOSEC) 20 MG capsule Take 1 capsule (20 mg total) by mouth daily. 90 capsule 3  . simvastatin (ZOCOR) 40 MG tablet Take 1 tablet (40 mg total) by mouth every evening. 90 tablet 1  . metFORMIN (GLUCOPHAGE) 1000 MG tablet Take 1 tablet (1,000 mg total) by mouth 2 (two) times daily with a meal. For the first week take only 1/2 tablet daily (Patient not taking: Reported on 09/09/2018) 60 tablet 3   No current facility-administered medications on file prior to visit.     Review of Systems:  As per HPI- otherwise negative. She is able to sleep ok    Physical Examination: Vitals:   09/09/18 1124  BP: 128/80  Pulse: 79  Resp: 16  Temp: 98 F (36.7 C)  SpO2: 97%   Vitals:   09/09/18 1124  Weight: 234 lb (106.1 kg)  Height: 5\' 7"  (1.702 m)   Body mass index is 36.65 kg/m. Ideal Body Weight: Weight in (lb) to have BMI = 25: 159.3  GEN: WDWN, NAD, Non-toxic, A & O x 3, obese, looks well  HEENT: Atraumatic, Normocephalic. Neck supple. No masses, No LAD. Ears and Nose: No external deformity. CV: RRR, No M/G/R. No JVD. No thrill. No extra heart sounds. PULM: CTA B, no wheezes, crackles, rhonchi. No retractions. No resp. distress. No accessory muscle use. ABD: S, NT, ND, +BS. No rebound. No HSM. EXTR: No c/c/e NEURO Normal gait.  PSYCH: Normally interactive. Conversant. Not depressed or anxious appearing.  Calm demeanor.  She notes tenderness in the paraspinous muscles bilaterally at the mid thoracic levels below the bra line. No tenderness of the bony spine No redness, swelling or bruise Normal thoracolumbar flexion, but extension is slightly stiff Normal BLE strength, sensation, DTR and SLR    Pt declined to have plain  films of her back today  Results for orders placed or performed in visit on 09/09/18  POCT urinalysis dipstick  Result Value Ref Range   Color, UA straw (A) yellow   Clarity, UA clear clear   Glucose, UA >=1,000 (A) negative mg/dL   Bilirubin, UA small (A) negative   Ketones, POC UA trace (5) (A)  negative mg/dL   Spec Grav, UA >=1.030 (A) 1.010 - 1.025   Blood, UA negative negative   pH, UA 6.0 5.0 - 8.0   Protein Ur, POC trace (A) negative mg/dL   Urobilinogen, UA 0.2 0.2 or 1.0 E.U./dL   Nitrite, UA Negative Negative   Leukocytes, UA Negative Negative  POCT glucose (manual entry)  Result Value Ref Range   POC Glucose 229 (A) 70 - 99 mg/dl    Assessment and Plan: Mid back pain - Plan: POCT urinalysis dipstick, cyclobenzaprine (FLEXERIL) 10 MG tablet  Glycosuria - Plan: POCT glucose (manual entry), glipiZIDE (GLUCOTROL) 5 MG tablet  Type 2 diabetes mellitus with other circulatory complication, without long-term current use of insulin (HCC)  Strain of mid-back, initial encounter  Mid back strain/ pain Flexeril as needed She declines x-rays today. Will do if her sx do not get better.  Continue heat and gentle stretching Obtained UA to look for any sign of a urinary tract infection.  Negative, but she does have large glucose Checked FS GB to make sure not overly high  intolerance to metformin- will DC this med and have her start on glipizide 5mg   She is seeing Dr. Larose Kells soon to further discuss her DM   Signed Lamar Blinks, MD

## 2018-09-09 NOTE — Patient Instructions (Signed)
Use the flexeril as needed for your back pain. This will make you drowsy so use caution.   Continue to use heat, stretching as needed If your pain is not coming under control please alert me  Stop using metformin as it is causing excessive side effects Will have you use glipizide 5 mg once a day instead.

## 2018-09-16 ENCOUNTER — Ambulatory Visit (HOSPITAL_BASED_OUTPATIENT_CLINIC_OR_DEPARTMENT_OTHER)
Admission: RE | Admit: 2018-09-16 | Discharge: 2018-09-16 | Disposition: A | Discharge status: Home / Self Care | Payer: Managed Care, Other (non HMO) | Source: Ambulatory Visit | Attending: Internal Medicine | Admitting: Internal Medicine

## 2018-09-16 ENCOUNTER — Encounter: Payer: Self-pay | Admitting: Internal Medicine

## 2018-09-16 ENCOUNTER — Ambulatory Visit: Payer: Managed Care, Other (non HMO) | Admitting: Internal Medicine

## 2018-09-16 VITALS — BP 118/70 | HR 76 | Temp 98.3°F | Resp 16 | Ht 67.0 in | Wt 235.0 lb

## 2018-09-16 DIAGNOSIS — M47894 Other spondylosis, thoracic region: Secondary | ICD-10-CM | POA: Diagnosis not present

## 2018-09-16 DIAGNOSIS — M549 Dorsalgia, unspecified: Secondary | ICD-10-CM | POA: Diagnosis present

## 2018-09-16 DIAGNOSIS — E1159 Type 2 diabetes mellitus with other circulatory complications: Secondary | ICD-10-CM

## 2018-09-16 DIAGNOSIS — I10 Essential (primary) hypertension: Secondary | ICD-10-CM | POA: Diagnosis not present

## 2018-09-16 DIAGNOSIS — E785 Hyperlipidemia, unspecified: Secondary | ICD-10-CM

## 2018-09-16 MED ORDER — EMPAGLIFLOZIN 10 MG PO TABS: 10.0000 mg | ORAL_TABLET | Freq: Every day | ORAL | 2 refills | Status: DC

## 2018-09-16 MED ORDER — METHOCARBAMOL 750 MG PO TABS: 750.0000 mg | ORAL_TABLET | Freq: Two times a day (BID) | ORAL | 0 refills | Status: DC | PRN

## 2018-09-16 MED ORDER — EMPAGLIFLOZIN 10 MG PO TABS: 10.0000 mg | ORAL_TABLET | Freq: Every day | ORAL | 0 refills | Status: DC

## 2018-09-16 MED ORDER — MELOXICAM 7.5 MG PO TABS: 7.5000 mg | ORAL_TABLET | Freq: Every day | ORAL | 0 refills | Status: DC | PRN

## 2018-09-16 NOTE — Progress Notes (Signed)
Subjective:    Patient ID: Wanda Ortega, female    DOB: 1961-04-13, 57 y.o.   MRN: [id number]  DOS:  09/16/2018 Type of visit - description : rov Interval history: Dm: Since the last visit, she try metformin, developed severe diarrhea even after 1 tablet. Low back pain: Symptoms started 09/05/2018 immediately after she twisted her torso trying to reach down, the pain is located at the thoracic spine, bilaterally, increased by moving her torso, deep breaths or bending. Was seen here, prescribed Flexeril, did not help.  Declined a x-ray. In the last few days, the pain is radiating to the right lateral abdomen and right upper quadrant. HTN: Good compliance with medication.   Review of Systems  Denies fever, chills. No rash on the back or upper abdomen Appetite is normal No nausea, vomiting, diarrhea. Other than the irradiated right-sided pain, no abdominal pain per se.  Past Medical History:  Diagnosis Date  . Anemia    EGD and Cscope 2008; Novasure 2010 for excessive vag bleed  . Breast cancer ([place]) 05/21/2011  . Colon polyps   . Contraception    husband's vasectomy  . Depression   . Diabetes mellitus without complication ([place])   . Dyslipidemia   . GERD (gastroesophageal reflux disease) 2016   Dr. Shana Chute  . Hypertension   . Migraines   . OSA on CPAP     Past Surgical History:  Procedure Laterality Date  . BREAST LUMPECTOMY Left July 2012   . CHOLECYSTECTOMY    . [place]  01/2009    Social History   Socioeconomic History  . Marital status: Married    Spouse name: Not on file  . Number of children: 1  . Years of education: Not on file  . Highest education level: Not on file  Occupational History  . Occupation: not working at present   Social Needs  . Financial resource strain: Not on file  . Food insecurity:    Worry: Not on file    Inability: Not on file  . Transportation needs:    Medical: Not on file    Non-medical: Not on file  Tobacco  Use  . Smoking status: Current Every Day Smoker    Packs/day: 0.50    Types: Cigarettes  . Smokeless tobacco: Never Used  . Tobacco comment: > 0.5 to 1 PPD x 40 year  Substance and Sexual Activity  . Alcohol use: No    Alcohol/week: 0.0 standard drinks  . Drug use: No  . Sexual activity: Yes    Birth control/protection: Surgical    Comment: husband had vasectomy   Lifestyle  . Physical activity:    Days per week: Not on file    Minutes per session: Not on file  . Stress: Not on file  Relationships  . Social connections:    Talks on phone: Not on file    Gets together: Not on file    Attends religious service: Not on file    Active member of club or organization: Not on file    Attends meetings of clubs or organizations: Not on file    Relationship status: Not on file  . Intimate partner violence:    Fear of current or ex partner: Not on file    Emotionally abused: Not on file    Physically abused: Not on file    Forced sexual activity: Not on file  Other Topics Concern  . Not on file  Social History Narrative  birth control, husband had a vasectomy   1 daughter, 2 g-kids       Allergies as of 09/16/2018      Reactions   Metformin And Related Diarrhea      Medication List        Accurate as of 09/16/18 11:59 PM. Always use your most recent med list.          carvedilol 25 MG tablet Commonly known as:  COREG Take 1 tablet (25 mg total) by mouth 2 (two) times daily with a meal.   cloNIDine 0.1 MG tablet Commonly known as:  CATAPRES Take 1 tablet (0.1 mg total) by mouth 2 (two) times daily.   cyclobenzaprine 10 MG tablet Commonly known as:  FLEXERIL Take 1 tablet (10 mg total) by mouth 2 (two) times daily as needed for muscle spasms. May also take a 1/2 tablet   empagliflozin 10 MG Tabs tablet Commonly known as:  JARDIANCE Take 10 mg by mouth daily.   empagliflozin 10 MG Tabs tablet Commonly known as:  JARDIANCE Take 10 mg by mouth daily.     felodipine 5 MG 24 hr tablet Commonly known as:  PLENDIL Take 1 tablet (5 mg total) by mouth daily.   glipiZIDE 5 MG tablet Commonly known as:  GLUCOTROL Take 1 tablet (5 mg total) by mouth daily before breakfast.   meloxicam 7.5 MG tablet Commonly known as:  MOBIC Take 1-2 tablets (7.5-15 mg total) by mouth daily as needed for pain.   methocarbamol 750 MG tablet Commonly known as:  ROBAXIN Take 1 tablet (750 mg total) by mouth 2 (two) times daily as needed for muscle spasms.   omeprazole 20 MG capsule Commonly known as:  PRILOSEC Take 1 capsule (20 mg total) by mouth daily.   simvastatin 40 MG tablet Commonly known as:  ZOCOR Take 1 tablet (40 mg total) by mouth every evening.          Objective:   Physical Exam  Skin:      BP 118/70 (BP Location: Left Arm, Patient Position: Sitting, Cuff Size: Small)   Pulse 76   Temp 98.3 F (36.8 C) (Oral)   Resp 16   Ht 5\' 7"  (1.702 m)   Wt 235 lb (106.6 kg)   SpO2 98%   BMI 36.81 kg/m  General:   Well developed, NAD, see BMI.  HEENT:  Normocephalic . Face symmetric, atraumatic Lungs:  CTA B Normal respiratory effort, no intercostal retractions, no accessory muscle use. Heart: RRR,  no murmur.  no pretibial edema bilaterally  Abdomen:  Not distended, soft, non-tender. No rebound or rigidity.   Skin: Not pale. Not jaundice.  No rash on the back or abdomen Neurologic:  alert & oriented X3.  Speech normal, gait appropriate for age and unassisted. Motor and DTR symmetric + antalgic posture when she got up from the table Psych--  Cognition and judgment appear intact.  Cooperative with normal attention span and concentration.  Behavior appropriate. No anxious or depressed appearing.     Assessment & Plan:   Assessment  DM HTN Dyslipidemia Depression - declined SSRIs before  OSA severe per home study 04/2017 Breast cancer ---dx 2012, left lumpectomy Smoker GERD: EGD 02-2017: Pathology negative for H. Pylori,  esophagus  was a stretched Migraines  Bladder spasms --sees gyn Contraception husband's vasectomy +FH: CAD (father), DM H/o Anemia --- EGD colonoscopy 2008; NovaSure 2010 for excessive bleeding H/O Acute DVT -R- dx 09-2015, on xarelto x 6 months, saw  hem-onc, due to Tamoxifen   PLAN DM: Based on last A1c, metformin was prescribed, she developed severe diarrhea, even with 1 tablet daily, declines to reintroduce metformin at a lower dose. She just started glipizide rx by Dr Lorelei Pont; I will add Jardiance 10 mg, hopefully cost won't be an issue, discount cards provided.  Watch for GU infections or rash  HTN: Check a BMP.  Continue same meds High cholesterol: Last LDL 110, will work on her diabetes first before we address LDL. Thoracic pain: Acute onset, most likely mechanical on clinical grounds.  Flexeril did not help. Plan: X-rays, warm compress, meloxicam 7.5 mg, 1 or 2 p.o. daily as needed with GI precautions, Robaxin 750 twice daily as needed.  Watch for drowsiness. If not better will call for a referral. RTC 2 months

## 2018-09-16 NOTE — Patient Instructions (Addendum)
GO TO THE LAB : Get the blood work     GO TO THE FRONT DESK Schedule your next appointment for a checkup in 2 months  Get a  x-ray at the first floor   For pain: Warm compresses Meloxicam 1 or 2 tablets once a day as needed for pain.   Always take it with food because may cause gastritis and ulcers.  If you notice nausea, stomach pain, change in the color of stools --->  Stop the medicine and let us know Try a different muscle relaxant, Robaxin, 1 tablet twice a day as needed, watch for drowsiness Call if not gradually better  For diabetes: Jardiance 1 tablet daily.  Need to keep yourself hydrated Watch for vaginal infections.

## 2018-09-16 NOTE — Progress Notes (Signed)
Pre visit review using our clinic review tool, if applicable. No additional management support is needed unless otherwise documented below in the visit note. 

## 2018-09-17 NOTE — Assessment & Plan Note (Signed)
DM: Based on last A1c, metformin was prescribed, she developed severe diarrhea, even with 1 tablet daily, declines to reintroduce metformin at a lower dose. She just started glipizide rx by Dr Lorelei Pont; I will add Jardiance 10 mg, hopefully cost won't be an issue, discount cards provided.  Watch for GU infections or rash  HTN: Check a BMP.  Continue same meds High cholesterol: Last LDL 110, will work on her diabetes first before we address LDL. Thoracic pain: Acute onset, most likely mechanical on clinical grounds.  Flexeril did not help. Plan: X-rays, warm compress, meloxicam 7.5 mg, 1 or 2 p.o. daily as needed with GI precautions, Robaxin 750 twice daily as needed.  Watch for drowsiness. If not better will call for a referral. RTC 2 months

## 2018-09-29 ENCOUNTER — Telehealth: Payer: Self-pay | Admitting: Internal Medicine

## 2018-09-29 NOTE — Telephone Encounter (Signed)
Pt requesting refill on meloxicam 7.5mg . Last Filled: 09/16/2018 #30 and 0RF. Pt sig: 1-2 tabs qd prn. Okay to refill?

## 2018-09-29 NOTE — Telephone Encounter (Signed)
Sent!

## 2018-10-11 ENCOUNTER — Other Ambulatory Visit: Payer: Self-pay | Admitting: Internal Medicine

## 2018-11-21 IMAGING — CT CT CHEST LUNG CANCER SCREENING LOW DOSE W/O CM
2 of 4 series · 15 of 40 positions shown, 18 images · non-contrast
Comparison: None.

CLINICAL DATA: Current smoker, 82 pack-year history.

EXAM:
CT CHEST WITHOUT CONTRAST LOW-DOSE FOR LUNG CANCER SCREENING
TECHNIQUE: Multidetector CT imaging of the chest was performed following the
standard protocol without IV contrast.

[Series 2: axial st · axial · 0.72mm/px · z∈[-324,-69]mm · 12 of 61 slices shown, 15 images]
[im 5/61  mediastinal]
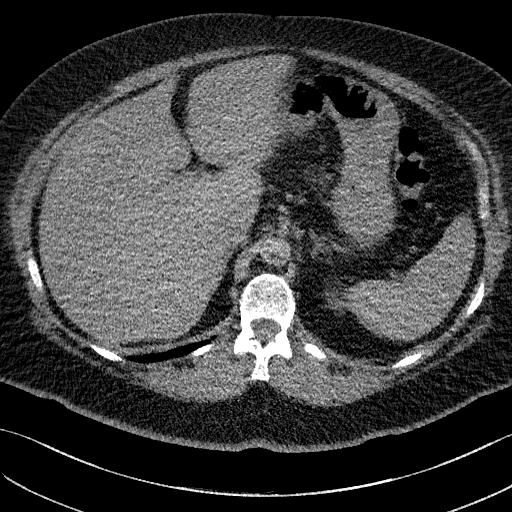
[im 5/61  lung]
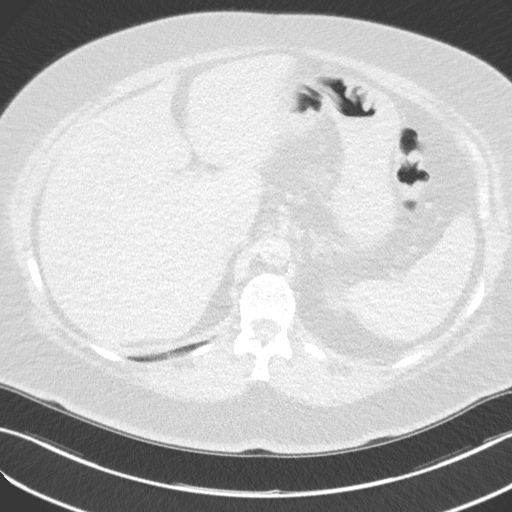
[im 10/61  lung]
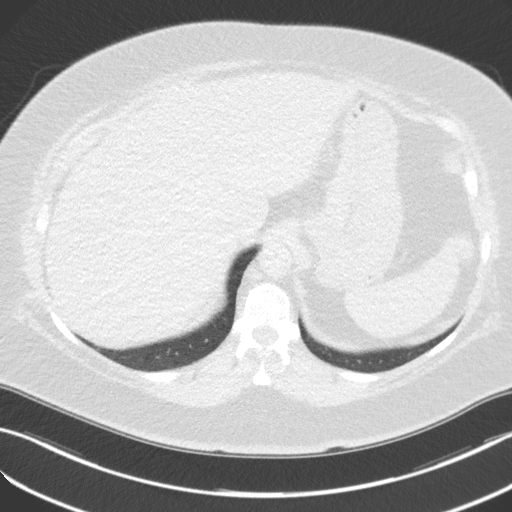
[im 14/61  lung]
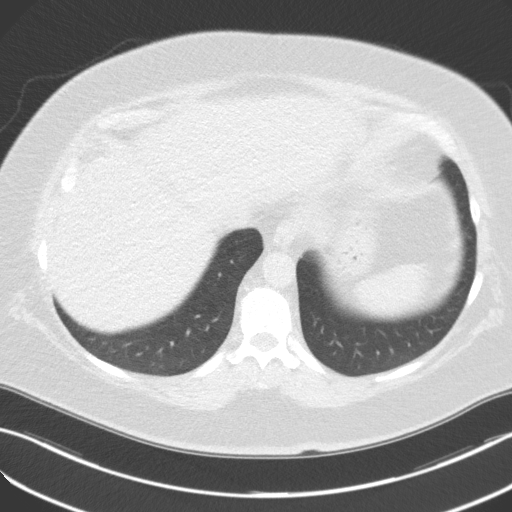
[im 19/61  lung]
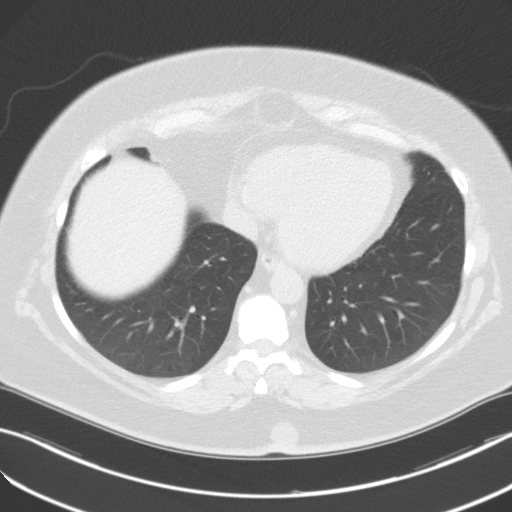
[im 24/61  mediastinal]
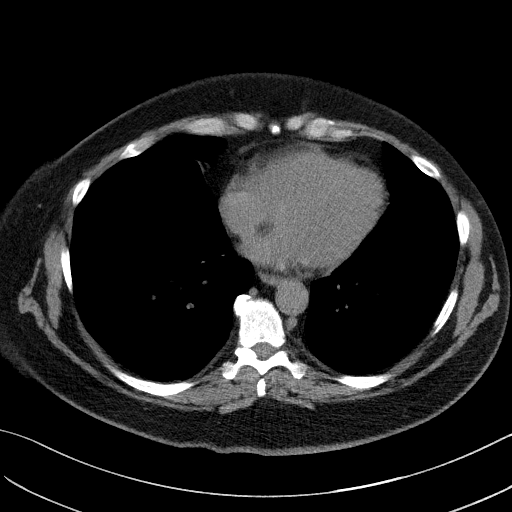
[im 24/61  lung]
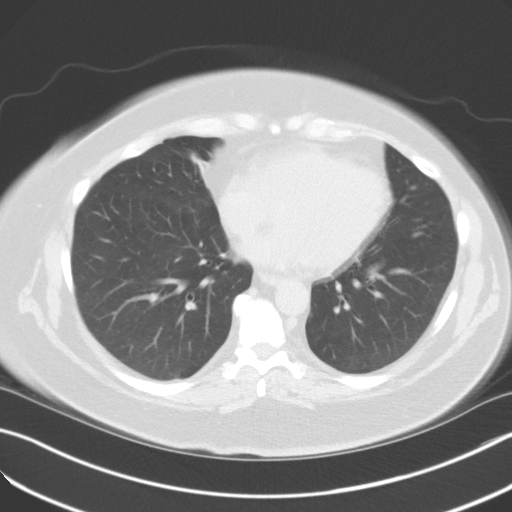
[im 28/61  lung]
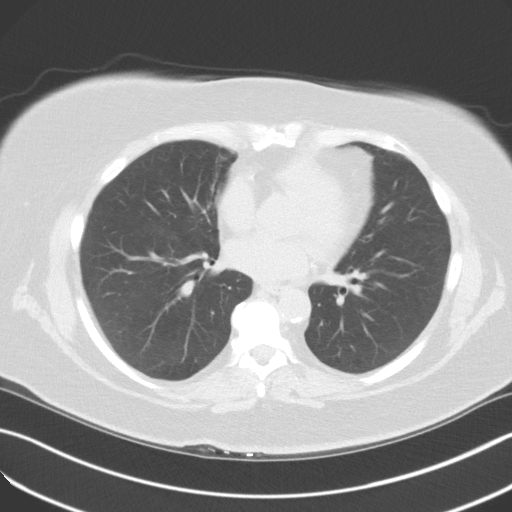
[im 33/61  lung]
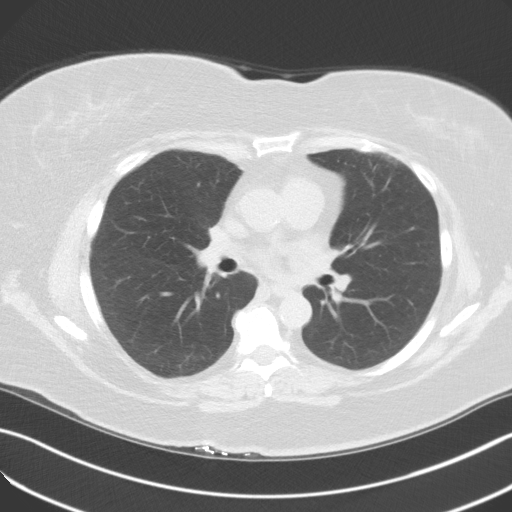
[im 37/61  lung]
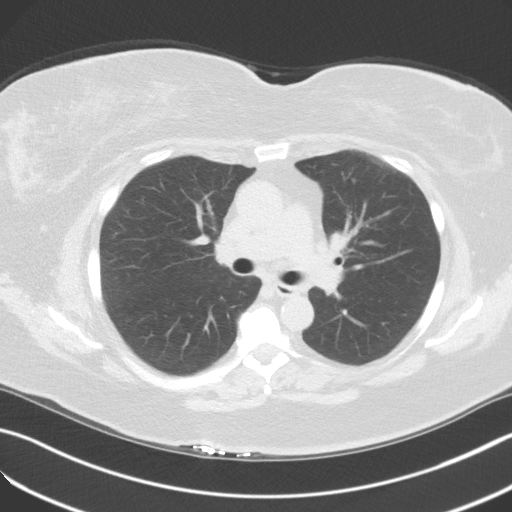
[im 42/61  mediastinal]
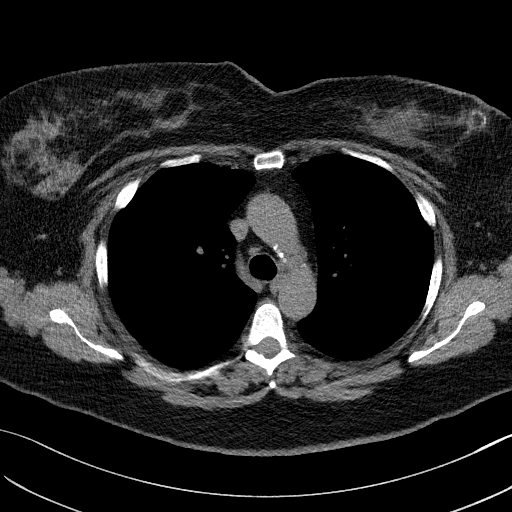
[im 42/61  lung]
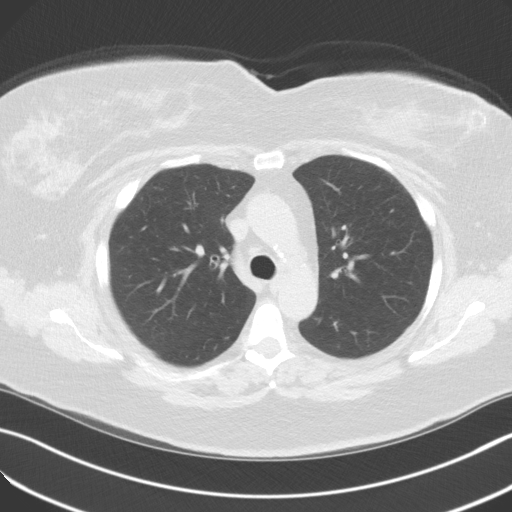
[im 47/61  lung]
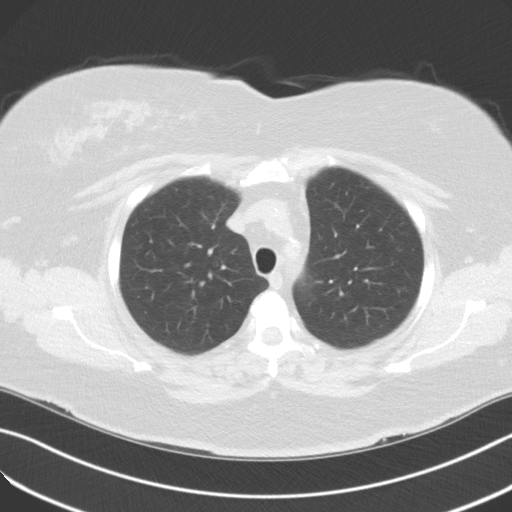
[im 51/61  lung]
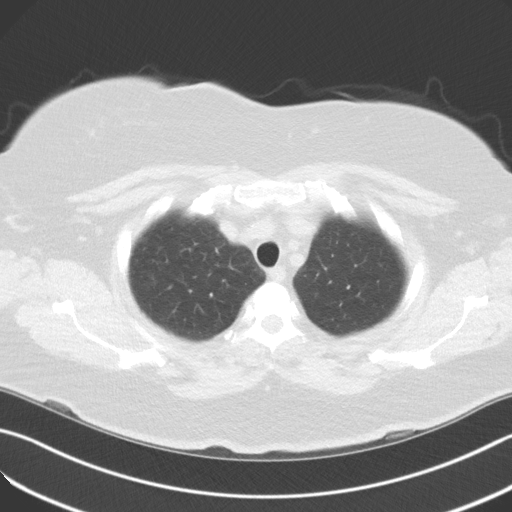
[im 56/61  lung]
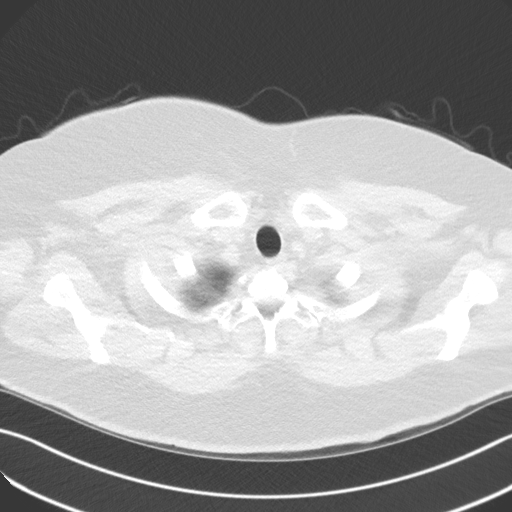

[Series 5: coronal · coronal · 0.59mm/px · 3 of 244 slices shown]
[im 49/244  lung]
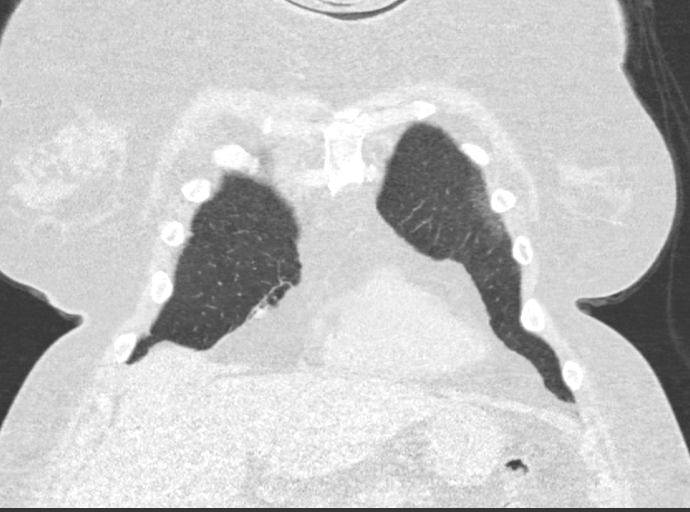
[im 98/244  lung]
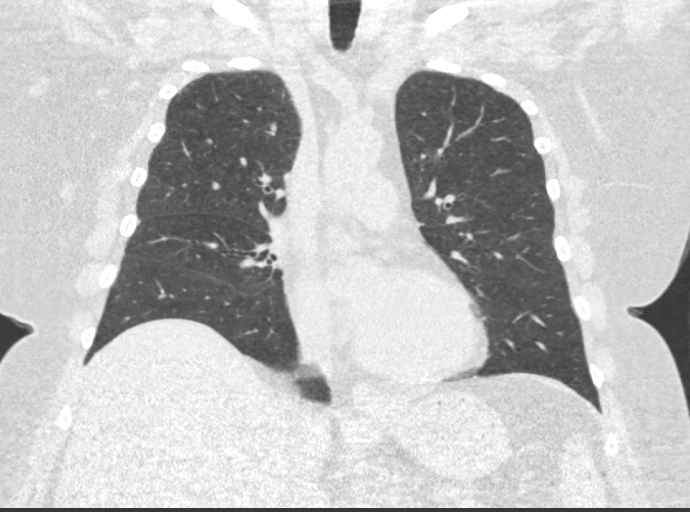
[im 146/244  lung]
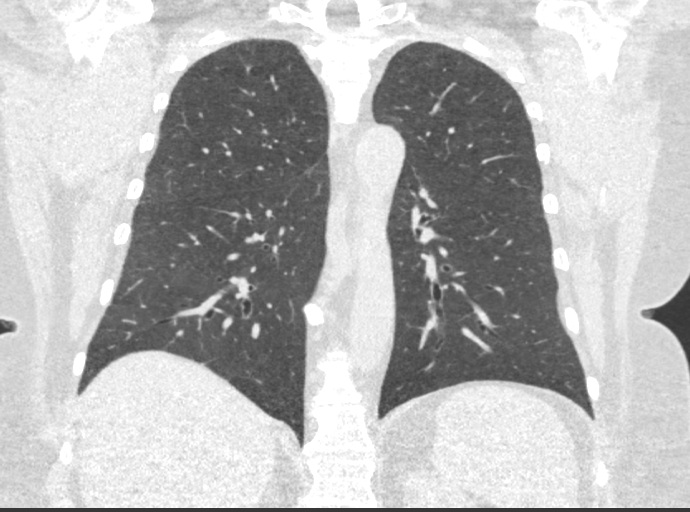

[15 of 40 positions shown; findings below may reference images not displayed]

FINDINGS: Cardiovascular: Atherosclerotic calcification of the arterial
vasculature, including coronary arteries. Heart size normal. No
pericardial effusion.

Mediastinum/Nodes: Low right paratracheal lymph node measures 12 mm
and has a fatty hilum. Hilar regions are difficult to definitively
evaluate without IV contrast. No axillary adenopathy. Esophagus is
grossly unremarkable.

Lungs/Pleura: Centrilobular emphysema. Scarring in the right middle
lobe. Subpleural radiation fibrosis in the anterior left upper lobe.
No worrisome pulmonary nodules. No pleural fluid. Airway is
unremarkable.

Upper Abdomen: Visualized portions of the liver, adrenal glands,
left kidney, spleen, pancreas, stomach and bowel are grossly
unremarkable. No upper abdominal adenopathy.

Musculoskeletal: Degenerative changes in the spine. No worrisome
lytic or sclerotic lesions.
IMPRESSION: 1. Lung-RADS 1, negative. Continue annual screening with low-dose
chest CT without contrast in 12 months.
2. Aortic atherosclerosis ([id number]-170.0). Coronary artery
calcification.
3.  Emphysema ([id number].Y).

## 2018-11-25 ENCOUNTER — Other Ambulatory Visit: Payer: Self-pay | Admitting: Internal Medicine

## 2018-11-25 DIAGNOSIS — I10 Essential (primary) hypertension: Secondary | ICD-10-CM

## 2018-12-02 ENCOUNTER — Ambulatory Visit: Payer: Managed Care, Other (non HMO) | Admitting: Neurology

## 2019-01-12 ENCOUNTER — Other Ambulatory Visit: Payer: Self-pay | Admitting: Family Medicine

## 2019-01-12 ENCOUNTER — Other Ambulatory Visit: Payer: Self-pay | Admitting: Internal Medicine

## 2019-01-12 DIAGNOSIS — R81 Glycosuria: Secondary | ICD-10-CM

## 2019-03-03 ENCOUNTER — Encounter: Payer: Self-pay | Admitting: Internal Medicine

## 2019-03-03 ENCOUNTER — Other Ambulatory Visit: Payer: Self-pay

## 2019-03-03 ENCOUNTER — Ambulatory Visit (INDEPENDENT_AMBULATORY_CARE_PROVIDER_SITE_OTHER): Payer: Managed Care, Other (non HMO) | Admitting: Internal Medicine

## 2019-03-03 DIAGNOSIS — E1169 Type 2 diabetes mellitus with other specified complication: Secondary | ICD-10-CM

## 2019-03-03 DIAGNOSIS — Z9989 Dependence on other enabling machines and devices: Secondary | ICD-10-CM

## 2019-03-03 DIAGNOSIS — E785 Hyperlipidemia, unspecified: Secondary | ICD-10-CM

## 2019-03-03 DIAGNOSIS — I10 Essential (primary) hypertension: Secondary | ICD-10-CM | POA: Diagnosis not present

## 2019-03-03 DIAGNOSIS — G4733 Obstructive sleep apnea (adult) (pediatric): Secondary | ICD-10-CM

## 2019-03-03 NOTE — Progress Notes (Signed)
Subjective:    Patient ID: Wanda Ortega, female    DOB: [birth year], 58 y.o.   MRN: [id number]  DOS:  03/03/2019 Type of visit - description: Virtual Visit via Video Note  I connected with@ on 03/03/19 at  4:00 PM EDT by a video enabled telemedicine application and verified that I am speaking with the correct person using two identifiers.   THIS ENCOUNTER IS A VIRTUAL VISIT DUE TO COVID-19 - PATIENT WAS NOT SEEN IN THE OFFICE. PATIENT HAS CONSENTED TO VIRTUAL VISIT / TELEMEDICINE VISIT   Location of patient: home  Location of provider: office  I discussed the limitations of evaluation and management by telemedicine and the availability of in person appointments. The patient expressed understanding and agreed to proceed.  History of Present Illness: ROV HTN: Good med compliance, BP was checked once few days ago, 158/85 DM: Only taking glipizide, no ambulatory CBGs OSA: Good compliance with CPAP without apparent problems. High cholesterol: Due for labs.   Review of Systems Denies fever chills No chest pain. Every morning after she wakes up she coughs for a little bit and  get short of breath but the rest of the day she is fine. Still smoking, not motivated to quit.  Past Medical History:  Diagnosis Date  . Anemia    EGD and Cscope 2008; Novasure 2010 for excessive vag bleed  . Breast cancer ([place]) 05/21/2011  . Colon polyps   . Contraception    husband's vasectomy  . Depression   . Diabetes mellitus without complication ([place])   . Dyslipidemia   . GERD (gastroesophageal reflux disease) 2016   Dr. Shana Chute  . Hypertension   . Migraines   . OSA on CPAP     Past Surgical History:  Procedure Laterality Date  . BREAST LUMPECTOMY Left July 2012   . CHOLECYSTECTOMY    . [place]  01/2009    Social History   Socioeconomic History  . Marital status: Married    Spouse name: Not on file  . Number of children: 1  . Years of education: Not on file  . Highest  education level: Not on file  Occupational History  . Occupation: not working at present   Social Needs  . Financial resource strain: Not on file  . Food insecurity:    Worry: Not on file    Inability: Not on file  . Transportation needs:    Medical: Not on file    Non-medical: Not on file  Tobacco Use  . Smoking status: Current Every Day Smoker    Packs/day: 0.50    Types: Cigarettes  . Smokeless tobacco: Never Used  . Tobacco comment: > 0.5 to 1 PPD x 40 year  Substance and Sexual Activity  . Alcohol use: No    Alcohol/week: 0.0 standard drinks  . Drug use: No  . Sexual activity: Yes    Birth control/protection: Surgical    Comment: husband had vasectomy   Lifestyle  . Physical activity:    Days per week: Not on file    Minutes per session: Not on file  . Stress: Not on file  Relationships  . Social connections:    Talks on phone: Not on file    Gets together: Not on file    Attends religious service: Not on file    Active member of club or organization: Not on file    Attends meetings of clubs or organizations: Not on file    Relationship status: Not  on file  . Intimate partner violence:    Fear of current or ex partner: Not on file    Emotionally abused: Not on file    Physically abused: Not on file    Forced sexual activity: Not on file  Other Topics Concern  . Not on file  Social History Narrative   birth control, husband had a vasectomy   1 daughter, 2 g-kids       Allergies as of 03/03/2019      Reactions   Metformin And Related Diarrhea      Medication List       Accurate as of March 03, 2019  3:47 PM. Always use your most recent med list.        carvedilol 25 MG tablet Commonly known as:  COREG Take 1 tablet (25 mg total) by mouth 2 (two) times daily with a meal.   cloNIDine 0.1 MG tablet Commonly known as:  CATAPRES Take 1 tablet (0.1 mg total) by mouth 2 (two) times daily.   cyclobenzaprine 10 MG tablet Commonly known as:  FLEXERIL  Take 1 tablet (10 mg total) by mouth 2 (two) times daily as needed for muscle spasms. May also take a 1/2 tablet   empagliflozin 10 MG Tabs tablet Commonly known as:  Jardiance Take 10 mg by mouth daily.   empagliflozin 10 MG Tabs tablet Commonly known as:  Jardiance Take 10 mg by mouth daily.   felodipine 5 MG 24 hr tablet Commonly known as:  PLENDIL Take 1 tablet (5 mg total) by mouth daily.   glipiZIDE 5 MG tablet Commonly known as:  GLUCOTROL TAKE 1 TABLET(5 MG) BY MOUTH DAILY BEFORE BREAKFAST   meloxicam 7.5 MG tablet Commonly known as:  MOBIC Take 1-2 tablets (7.5-15 mg total) by mouth daily as needed for pain.   methocarbamol 750 MG tablet Commonly known as:  Robaxin-750 Take 1 tablet (750 mg total) by mouth 2 (two) times daily as needed for muscle spasms.   omeprazole 20 MG capsule Commonly known as:  PRILOSEC Take 1 capsule (20 mg total) by mouth daily.   simvastatin 40 MG tablet Commonly known as:  ZOCOR Take 1 tablet (40 mg total) by mouth every evening.           Objective:   Physical Exam There were no vitals taken for this visit. This is a video visit, alert oriented x3, in no apparent distress    Assessment     Assessment  DM  HTN diarrhea with metformin Dyslipidemia Depression - declined SSRIs before  OSA severe per home study 04/2017, good CPAP compliance is off/2020 Breast cancer ---dx 2012, left lumpectomy Smoker GERD: EGD 02-2017: Pathology negative for H. Pylori, esophagus  was a stretched Migraines  Bladder spasms --sees gyn Contraception husband's vasectomy +FH: CAD (father), DM H/o Anemia --- EGD colonoscopy 2008; NovaSure 2010 for excessive bleeding H/O Acute DVT -R- dx 09-2015, on xarelto x 6 months, saw hem-onc, due to Tamoxifen   PLAN DM: Currently on glipizide, based on the last A1c she was prescribed Jardiance, elected not to start it, liked to work on diet and exercise.  Unfortunately lifestyle has not improved much.  No  ambulatory CBGs.  We will get A1c. HTN, had a single reading of 158/85, currently on clonidine, Plendil, carvedilol.  Recommend ambulatory BPs, see Mychart message with instructions.  Checking CMP and CBC OSA: Good compliance with CPAP High cholesterol: On simvastatin, due for labs.  Check a [place] COVID-19 education:  She is following all CDC recommendations, I strongly encouraged her to continue doing that. Plan: Labs tomorrow between 1030 and 11 AM. Next visit in 3 months, hopefully face-to-face.   I discussed the assessment and treatment plan with the patient. The patient was provided an opportunity to ask questions and all were answered. The patient agreed with the plan and demonstrated an understanding of the instructions.   The patient was advised to call back or seek an in-person evaluation if the symptoms worsen or if the condition fails to improve as anticipated.

## 2019-03-04 ENCOUNTER — Other Ambulatory Visit: Payer: Self-pay

## 2019-03-04 DIAGNOSIS — I10 Essential (primary) hypertension: Secondary | ICD-10-CM

## 2019-03-04 DIAGNOSIS — Z9989 Dependence on other enabling machines and devices: Secondary | ICD-10-CM

## 2019-03-04 DIAGNOSIS — G4733 Obstructive sleep apnea (adult) (pediatric): Secondary | ICD-10-CM | POA: Insufficient documentation

## 2019-03-04 DIAGNOSIS — E785 Hyperlipidemia, unspecified: Secondary | ICD-10-CM

## 2019-03-04 DIAGNOSIS — E1159 Type 2 diabetes mellitus with other circulatory complications: Secondary | ICD-10-CM

## 2019-03-04 NOTE — Assessment & Plan Note (Signed)
DM: Currently on glipizide, based on the last A1c she was prescribed Jardiance, elected not to start it, liked to work on diet and exercise.  Unfortunately lifestyle has not improved much.  No ambulatory CBGs.  We will get A1c. HTN, had a single reading of 158/85, currently on clonidine, Plendil, carvedilol.  Recommend ambulatory BPs, see Mychart message with instructions.  Checking CMP and CBC OSA: Good compliance with CPAP High cholesterol: On simvastatin, due for labs.  Check a [facility] COVID-19 education: She is following all CDC recommendations, I strongly encouraged her to continue doing that. Plan: Labs tomorrow between 1030 and 11 AM. Next visit in 3 months, hopefully face-to-face.

## 2019-03-05 ENCOUNTER — Other Ambulatory Visit: Payer: Managed Care, Other (non HMO)

## 2019-03-08 ENCOUNTER — Other Ambulatory Visit: Payer: Managed Care, Other (non HMO)

## 2019-04-06 ENCOUNTER — Telehealth: Payer: Self-pay | Admitting: Family Medicine

## 2019-04-06 NOTE — Telephone Encounter (Signed)
Due to current COVID 19 pandemic, our office is severely reducing in office visits until further notice, in order to minimize the risk to our patients and healthcare providers.   Called patient regarding her 5/27 appt with Dr. Brett Fairy. Patient agreed to a virtual visit for her 5/27 appointment and rescheduled with Amy NP due to Dr. Brett Fairy being out. Patient verbalized understanding of the doxy.me process and I have sent her an e-mail with link and directions as well as my name and office number/hours for reference. Patient understands that she will receive a call from RN to update chart.  Pt understands that although there may be some limitations with this type of visit, we will take all precautions to reduce any security or privacy concerns.  Pt understands that this will be treated like an in office visit and we will file with pt's insurance, and there may be a patient responsible charge related to this service.

## 2019-04-11 ENCOUNTER — Other Ambulatory Visit: Payer: Self-pay | Admitting: Internal Medicine

## 2019-04-14 ENCOUNTER — Ambulatory Visit (INDEPENDENT_AMBULATORY_CARE_PROVIDER_SITE_OTHER): Payer: Managed Care, Other (non HMO) | Admitting: Family Medicine

## 2019-04-14 ENCOUNTER — Other Ambulatory Visit: Payer: Self-pay

## 2019-04-14 ENCOUNTER — Ambulatory Visit: Payer: Self-pay | Admitting: Neurology

## 2019-04-14 ENCOUNTER — Encounter: Payer: Self-pay | Admitting: Family Medicine

## 2019-04-14 DIAGNOSIS — G4733 Obstructive sleep apnea (adult) (pediatric): Secondary | ICD-10-CM | POA: Diagnosis not present

## 2019-04-14 DIAGNOSIS — Z9989 Dependence on other enabling machines and devices: Secondary | ICD-10-CM

## 2019-04-14 NOTE — Progress Notes (Signed)
PATIENT: Wanda Ortega DOB: 08/27/1961  REASON FOR VISIT: follow up HISTORY FROM: patient  Virtual Visit via Telephone Note  I connected with Wanda Ortega on 04/14/19 at  3:30 PM EDT by telephone and verified that I am speaking with the correct person using two identifiers.   I discussed the limitations, risks, security and privacy concerns of performing an evaluation and management service by telephone and the availability of in person appointments. I also discussed with the patient that there may be a patient responsible charge related to this service. The patient expressed understanding and agreed to proceed.   History of Present Illness:  04/14/19 [name] is a 58 y.o. female here today for follow up.  She is doing well with CPAP therapy.  She reports that there are a couple of nights over the past 30 days where she has fallen asleep and forgotten to place her CPAP.  Otherwise she is using her machine every night.  She notes that she is sleeping much deeper.    03/15/2019 - 04/13/2019  04/13/2019 Usage days 23/30 days (77%) >= 4 hours 23 days (77%) < 4 hours 0 days (0%) Usage hours 135 hours 16 minutes Average usage (total days) 4 hours 31 minutes Average usage (days used) 5 hours 53 minutes Median usage (days used) 5 hours 50 minutes Total used hours (value since last reset - 04/13/2019) 3,602 hours AirSense 10 AutoSet Serial number [id number] Mode AutoSet Min Pressure 5 cmH2O Max Pressure 15 cmH2O EPR Fulltime EPR level 3 Response Standard Therapy Pressure - cmH2O Median: 7.5 95th percentile: 9.6 Maximum: 10.7 Leaks - L/min Median: 4.0 95th percentile: 12.9 Maximum: 17.0 Events per hour AI: 0.0 HI: 0.2 AHI: 0.2 Apnea Index Central: 0.0 Obstructive: 0.0 Unknown: 0.0 RERA Index 0.0 Cheyne-Stokes respiration (average duration per night) 0 minutes (0%)  History (copied from Dr Edwena Felty note on 09/03/2017)  HPI:  Wanda Ortega is a 57 y.o.  female , seen here as in a referral from Dr. Larose Kells for a sleep evaluation.   09-03-2017 Wanda Ortega was seen on 04/17/2017 and an initial consultation, a home sleep test was performed on 05/13/2017 which revealed severe sleep apnea with an AHI of 40.5 per hour of sleep, oxygen nadir at 77%, 50 minutes of total desaturation time, episodic bradycardia. Loud snoring was also noted. I had ordered an auto titration CPAP in response to these results. I'm also able for the first time to look at the patient's compliance. She has used her machine 26 out of 30 days and 20 of those days over 4 hours consecutively equal to a compliance of 67%.  Average user time is 4 hours and 31 minutes, AutoSet is between 5 and 15 cm water with 37 m EPR. The 91st percentile pressure is 10.6 cm water the residual AHI is 0.3 per hour, which is an excellent result. The patient does report having a dry mouth but this preceded CPAP use, she feels that her sleep has been more sounder, and she has more energy. Her Epworth sleepiness score is now endorsed at 10 points, she is not sleeping longer but better. She still uses a fit bit.   Wanda Ortega reports that her husband has witnessed her to snore and has apnea and just the other night told her that she stops breathing for 15-20 seconds. He is very concerned about this. She underwent a colonoscopy, endoscopy last month ( 02/2017) and while under sedation her husband was informed by the  anesthetist  that she had severe sleep apnea. the patient recalls that she had breathing problems during dental procedures when she was placed in supine position with a retro-flexed neck and couldn't breathe through her nose. 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 She begun snoring at age 88, after she broke her nose.    Sleep habits are as follows: The patient's usual bedtime is between midnight and 1:00 AM, and if she goes to bed earlier she  cannot sleep through the night.  She falls easier asleep on the couch. She usually wakes up several times, and about 2 hours after falling asleep she will wake by a bathroom call,  usually she falls asleep again within 15 minutes or so. This however repeats itself several times through the night. She considers a good night if she had 5 or more hours of sleep. Many nights she has less than 4 hours of sleep. She does not have a set rise time but usually is up by 8:30 AM. She does not describe vivid on nightmarish dreams. She does not use daytime naps.  Sleep medical history and family sleep history: Her father was affected by sleep apnea.  He has Parkinson's disease. Her older sister also has sleep apnea. She suffers from dyslipidemia, diabetes mellitus, reactive depression, gastroesophageal reflux disease, hypertension, migraine headaches, iron deficiency anemia, history of breast cancer in 2012, colon polyps.   "snoring all my adult life" mouth breathing. She had breast cancer and suffered a DVT in 2015 , was on xeralto   Social history:  Married, one daughter.  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 She is a current every day smoker, no ETOH use, caffeine user; tea at night and coffee in am , 3-5 a day. No shift work history.   Observations/Objective:  Generalized: Well developed, in no acute distress  Mentation: Alert oriented to time, place, history taking. Follows all commands speech and language fluent   Assessment and Plan:  58 y.o. year old female  has a past medical history of Anemia, Breast cancer ([place]) (05/21/2011), Colon polyps, Contraception, Depression, Diabetes mellitus without complication ([place]), Dyslipidemia, GERD (gastroesophageal reflux disease) (2016), Hypertension, Migraines, and OSA on CPAP. here with    ICD-10-CM   1. OSA on CPAP G47.33    Z99.89    Wanda Ortega is tolerating CPAP therapy very well.  I have encouraged her to use  her CPAP therapy nightly and for greater than 4 hours each night.  She may need to set an alarm to remind her should she continue to have nights where she is falling asleep.  We will follow-up with her annually, sooner if needed.  She verbalizes understanding and agreement with this plan.  No orders of the defined types were placed in this encounter.   No orders of the defined types were placed in this encounter.    Follow Up Instructions:  I discussed the assessment and treatment plan with the patient. The patient was provided an opportunity to ask questions and all were answered. The patient agreed with the plan and demonstrated an understanding of the instructions.   The patient was advised to call back or seek an in-person evaluation if the symptoms worsen or if the condition fails to improve as anticipated.  I provided 20 minutes of non-face-to-face time during this encounter.  Patient is located at her place of residence during video conference.  Provider is located at her place of residence.  Liane Comber, RN helped to facilitate visit.   Debbora Presto, NP

## 2019-05-13 ENCOUNTER — Other Ambulatory Visit: Payer: Self-pay | Admitting: Internal Medicine

## 2019-05-13 DIAGNOSIS — I10 Essential (primary) hypertension: Secondary | ICD-10-CM

## 2019-07-22 ENCOUNTER — Other Ambulatory Visit: Payer: Self-pay | Admitting: Internal Medicine

## 2019-07-22 DIAGNOSIS — R81 Glycosuria: Secondary | ICD-10-CM

## 2019-07-22 NOTE — Telephone Encounter (Signed)
Rx denied. Pt overdue for visit AND she never came to office for labs after virtual visit in 02/2019.

## 2019-10-05 ENCOUNTER — Other Ambulatory Visit: Payer: Self-pay

## 2019-10-05 ENCOUNTER — Encounter: Payer: Self-pay | Admitting: Internal Medicine

## 2019-10-05 ENCOUNTER — Ambulatory Visit (INDEPENDENT_AMBULATORY_CARE_PROVIDER_SITE_OTHER): Payer: Managed Care, Other (non HMO) | Admitting: Internal Medicine

## 2019-10-05 VITALS — BP 127/74

## 2019-10-05 DIAGNOSIS — I10 Essential (primary) hypertension: Secondary | ICD-10-CM

## 2019-10-05 DIAGNOSIS — E785 Hyperlipidemia, unspecified: Secondary | ICD-10-CM

## 2019-10-05 DIAGNOSIS — E119 Type 2 diabetes mellitus without complications: Secondary | ICD-10-CM | POA: Diagnosis not present

## 2019-10-05 NOTE — Progress Notes (Signed)
Subjective:    Patient ID: Wanda Ortega, female    DOB: Sep 24, 1961, 58 y.o.   MRN: [id number]  DOS:  10/05/2019 Type of visit - description: Virtual Visit via Video Note  I connected with@   by a video enabled telemedicine application and verified that I am speaking with the correct person using two identifiers.   THIS ENCOUNTER IS A VIRTUAL VISIT DUE TO COVID-19 - PATIENT WAS NOT SEEN IN THE OFFICE. PATIENT HAS CONSENTED TO VIRTUAL VISIT / TELEMEDICINE VISIT   Location of patient: home  Location of provider: office  I discussed the limitations of evaluation and management by telemedicine and the availability of in person appointments. The patient expressed understanding and agreed to proceed.  History of Present Illness: Routine follow-up OSA: Good compliance with CPAP High cholesterol: Good med compliance, due for labs DM: Ran out of glipizide 3 months ago, no ambulatory CBGs.  Not eating healthy. HTN: Ambulatory BP is very good, needs refills Tobacco: Still smoking, not ready to quit   Review of Systems Denies chest pain, no difficulty breathing No nausea, vomiting, diarrhea Feels tired sometimes, admits to feeling thirsty and urinating frequently.  Past Medical History:  Diagnosis Date  . Anemia    EGD and Cscope 2008; Novasure 2010 for excessive vag bleed  . Breast cancer ([place]) 05/21/2011  . Colon polyps   . Contraception    husband's vasectomy  . Depression   . Diabetes mellitus without complication ([place])   . Dyslipidemia   . GERD (gastroesophageal reflux disease) 2016   Dr. Shana Chute  . Hypertension   . Migraines   . OSA on CPAP     Past Surgical History:  Procedure Laterality Date  . BREAST LUMPECTOMY Left July 2012   . CHOLECYSTECTOMY    . [place]  01/2009    Social History   Socioeconomic History  . Marital status: Married    Spouse name: Not on file  . Number of children: 1  . Years of education: Not on file  . Highest education  level: Not on file  Occupational History  . Occupation: not working at present   Social Needs  . Financial resource strain: Not on file  . Food insecurity    Worry: Not on file    Inability: Not on file  . Transportation needs    Medical: Not on file    Non-medical: Not on file  Tobacco Use  . Smoking status: Current Every Day Smoker    Packs/day: 0.50    Types: Cigarettes  . Smokeless tobacco: Never Used  . Tobacco comment: > 0.5 to 1 PPD x 40 year  Substance and Sexual Activity  . Alcohol use: No    Alcohol/week: 0.0 standard drinks  . Drug use: No  . Sexual activity: Yes    Birth control/protection: Surgical    Comment: husband had vasectomy   Lifestyle  . Physical activity    Days per week: Not on file    Minutes per session: Not on file  . Stress: Not on file  Relationships  . Social Herbalist on phone: Not on file    Gets together: Not on file    Attends religious service: Not on file    Active member of club or organization: Not on file    Attends meetings of clubs or organizations: Not on file    Relationship status: Not on file  . Intimate partner violence    Fear of  current or ex partner: Not on file    Emotionally abused: Not on file    Physically abused: Not on file    Forced sexual activity: Not on file  Other Topics Concern  . Not on file  Social History Narrative   birth control, husband had a vasectomy   1 daughter, 2 g-kids       Allergies as of 10/05/2019      Reactions   Metformin And Related Diarrhea      Medication List       Accurate as of October 05, 2019  2:42 PM. If you have any questions, ask your nurse or doctor.        carvedilol 25 MG tablet Commonly known as: COREG Take 1 tablet (25 mg total) by mouth 2 (two) times daily with a meal.   cloNIDine 0.1 MG tablet Commonly known as: CATAPRES Take 1 tablet (0.1 mg total) by mouth 2 (two) times daily.   felodipine 5 MG 24 hr tablet Commonly known as: PLENDIL  Take 1 tablet (5 mg total) by mouth daily.   glipiZIDE 5 MG tablet Commonly known as: GLUCOTROL TAKE 1 TABLET(5 MG) BY MOUTH DAILY BEFORE BREAKFAST   meloxicam 7.5 MG tablet Commonly known as: MOBIC Take 1-2 tablets (7.5-15 mg total) by mouth daily as needed for pain.   omeprazole 20 MG capsule Commonly known as: PRILOSEC Take 1 capsule (20 mg total) by mouth daily.   simvastatin 40 MG tablet Commonly known as: ZOCOR Take 1 tablet (40 mg total) by mouth every evening.           Objective:   Physical Exam There were no vitals taken for this visit. This is a virtual video visit, she is alert oriented x3, overweight appearing.  In no distress.    Assessment     Assessment  DM  HTN diarrhea with metformin Dyslipidemia Depression - declined SSRIs before  OSA severe per home study 04/2017, good CPAP compliance is off/2020 Breast cancer ---dx 2012, left lumpectomy Smoker GERD: EGD 02-2017: Pathology negative for H. Pylori, esophagus  was a stretched Migraines  Bladder spasms --sees gyn Contraception husband's vasectomy +FH: CAD (father), DM H/o Anemia --- EGD colonoscopy 2008; NovaSure 2010 for excessive bleeding H/O Acute DVT -R- dx 09-2015, on xarelto x 6 months, saw hem-onc, due to Tamoxifen   PLAN  DM: Ran out of glipizide 3 months ago, will check a A1c.  Currently on no medications, further advised with results. HTN: BP today 127/74, RF carvedilol, Catapres, Plendil.  Check a CMP, CBC Dyslipidemia: On simvastatin, check FLP, TSH Morbid obesity: Discussed diet and exercise, rec to consider the wellness clinic. Tobacco abuse: Not ready to quit Preventive care: Recommend to start aspirin for primary prevention Declined a flu shot strongly. Follow-up: In few days for blood work Follow-up with me in 3 months  I discussed the assessment and treatment plan with the patient. The patient was provided an opportunity to ask questions and all were answered. The patient  agreed with the plan and demonstrated an understanding of the instructions.   The patient was advised to call back or seek an in-person evaluation if the symptoms worsen or if the condition fails to improve as anticipated.

## 2019-10-07 ENCOUNTER — Telehealth: Payer: Self-pay | Admitting: Internal Medicine

## 2019-10-07 ENCOUNTER — Other Ambulatory Visit: Payer: Self-pay

## 2019-10-07 ENCOUNTER — Encounter: Payer: Self-pay | Admitting: Internal Medicine

## 2019-10-07 ENCOUNTER — Other Ambulatory Visit (INDEPENDENT_AMBULATORY_CARE_PROVIDER_SITE_OTHER): Payer: Managed Care, Other (non HMO)

## 2019-10-07 DIAGNOSIS — I10 Essential (primary) hypertension: Secondary | ICD-10-CM | POA: Diagnosis not present

## 2019-10-07 DIAGNOSIS — E119 Type 2 diabetes mellitus without complications: Secondary | ICD-10-CM

## 2019-10-07 DIAGNOSIS — E785 Hyperlipidemia, unspecified: Secondary | ICD-10-CM

## 2019-10-07 LAB — LIPID PANEL
Cholesterol: 198 mg/dL (ref 0–200)
HDL: 32.9 mg/dL — ABNORMAL LOW (ref >39.00)
NonHDL: 164.86
Total CHOL/HDL Ratio: 6
Triglycerides: 327 mg/dL — ABNORMAL HIGH (ref 0.0–149.0)
VLDL: 65.4 mg/dL — ABNORMAL HIGH (ref 0.0–40.0)

## 2019-10-07 LAB — COMPREHENSIVE METABOLIC PANEL
ALT: 56 U/L — ABNORMAL HIGH (ref 0–35)
AST: 62 U/L — ABNORMAL HIGH (ref 0–37)
Albumin: 4.1 g/dL (ref 3.5–5.2)
Alkaline Phosphatase: 69 U/L (ref 39–117)
BUN: 9 mg/dL (ref 6–23)
CO2: 25 mEq/L (ref 19–32)
Calcium: 9.3 mg/dL (ref 8.4–10.5)
Chloride: 98 mEq/L (ref 96–112)
Creatinine, Ser: 0.86 mg/dL (ref 0.40–1.20)
GFR: 68.22 mL/min (ref >60.00)
Glucose, Bld: 448 mg/dL — ABNORMAL HIGH (ref 70–99)
Potassium: 4.5 mEq/L (ref 3.5–5.1)
Sodium: 135 mEq/L (ref 135–145)
Total Bilirubin: 0.5 mg/dL (ref 0.2–1.2)
Total Protein: 7.4 g/dL (ref 6.0–8.3)

## 2019-10-07 LAB — CBC WITH DIFFERENTIAL/PLATELET
Basophils Absolute: 0.1 10*3/uL (ref 0.0–0.1)
Basophils Relative: 0.8 % (ref 0.0–3.0)
Eosinophils Absolute: 0.3 10*3/uL (ref 0.0–0.7)
Eosinophils Relative: 2 % (ref 0.0–5.0)
HCT: 45.2 % (ref 36.0–46.0)
Hemoglobin: 14.3 g/dL (ref 12.0–15.0)
Lymphocytes Relative: 20.2 % (ref 12.0–46.0)
Lymphs Abs: 2.5 10*3/uL (ref 0.7–4.0)
MCHC: 31.6 g/dL (ref 30.0–36.0)
MCV: 77.3 fl — ABNORMAL LOW (ref 78.0–100.0)
Monocytes Absolute: 0.9 10*3/uL (ref 0.1–1.0)
Monocytes Relative: 7.1 % (ref 3.0–12.0)
Neutro Abs: 8.7 10*3/uL — ABNORMAL HIGH (ref 1.4–7.7)
Neutrophils Relative %: 69.9 % (ref 43.0–77.0)
Platelets: 312 10*3/uL (ref 150.0–400.0)
RBC: 5.85 Mil/uL — ABNORMAL HIGH (ref 3.87–5.11)
RDW: 15.5 % (ref 11.5–15.5)
WBC: 12.5 10*3/uL — ABNORMAL HIGH (ref 4.0–10.5)

## 2019-10-07 LAB — TSH: TSH: 1.61 u[IU]/mL (ref 0.35–4.50)

## 2019-10-07 LAB — LDL CHOLESTEROL, DIRECT: Direct LDL: 130 mg/dL

## 2019-10-07 LAB — HEMOGLOBIN A1C: Hgb A1c MFr Bld: 14.3 % — ABNORMAL HIGH (ref 4.6–6.5)

## 2019-10-07 MED ORDER — GLIPIZIDE 5 MG PO TABS: 5.0000 mg | ORAL_TABLET | Freq: Two times a day (BID) | ORAL | 6 refills | Status: AC

## 2019-10-07 MED ORDER — METFORMIN HCL 850 MG PO TABS: 850.0000 mg | ORAL_TABLET | Freq: Two times a day (BID) | ORAL | 6 refills | Status: AC

## 2019-10-07 MED ORDER — JARDIANCE 10 MG PO TABS: 10.0000 mg | ORAL_TABLET | Freq: Every day | ORAL | 6 refills | Status: AC

## 2019-10-07 MED ORDER — SIMVASTATIN 40 MG PO TABS: 40.0000 mg | ORAL_TABLET | Freq: Every evening | ORAL | 6 refills | Status: AC

## 2019-10-07 MED ORDER — OMEPRAZOLE 20 MG PO CPDR: 20.0000 mg | DELAYED_RELEASE_CAPSULE | Freq: Every day | ORAL | 6 refills | Status: AC

## 2019-10-07 MED ORDER — FELODIPINE ER 5 MG PO TB24: 5.0000 mg | ORAL_TABLET | Freq: Every day | ORAL | 6 refills | Status: AC

## 2019-10-07 MED ORDER — CARVEDILOL 25 MG PO TABS: 25.0000 mg | ORAL_TABLET | Freq: Two times a day (BID) | ORAL | 6 refills | Status: AC

## 2019-10-07 MED ORDER — CLONIDINE HCL 0.1 MG PO TABS: 0.1000 mg | ORAL_TABLET | Freq: Two times a day (BID) | ORAL | 6 refills | Status: AC

## 2019-10-07 NOTE — Assessment & Plan Note (Signed)
DM: Ran out of glipizide 3 months ago, will check a A1c.  Currently on no medications, further advised with results. HTN: BP today 127/74, RF carvedilol, Catapres, Plendil.  Check a CMP, CBC Dyslipidemia: On simvastatin, check FLP, TSH Morbid obesity: Discussed diet and exercise, rec to consider the wellness clinic. Tobacco abuse: Not ready to quit Preventive care: Recommend to start aspirin for primary prevention Declined a flu shot strongly. Follow-up: In few days for blood work Follow-up with me in 3 months

## 2019-10-07 NOTE — Telephone Encounter (Signed)
Labs reviewed: A1c 14.3.  WBCs, LFTs are slightly elevated.  Cholesterol not at goal. Will address diabetes first, restart Metformin, glipizide and start Jardiance. All of the above was discussed carefully with the patient and she is in agreement. F/u 4 weeks   Kaylyn Sent prescriptions to [facility]: 30 days and 6 refills:  Omeprazole, plendil , carvedilol, simvastatin and clonidine  Also sent 69-month supply of Metformin 850 mg: 1 tablet twice a day Glipizide 5 mg: 1 tablet twice a day Jardiance 10 mg 1 tablet daily

## 2019-10-07 NOTE — Telephone Encounter (Signed)
Rx's sent. Sherri- please call Pt- needs 4 week f/u in person please.

## 2019-10-07 NOTE — Addendum Note (Signed)
Addended byDamita Dunnings D on: 10/07/2019 03:18 PM   Modules accepted: Orders

## 2019-10-08 NOTE — Telephone Encounter (Signed)
done

## 2019-10-18 ENCOUNTER — Ambulatory Visit: Payer: Self-pay | Admitting: *Deleted

## 2019-10-18 NOTE — Telephone Encounter (Signed)
Woke up with blurred vision in both eyes Saturday morning. Denies ALL other symptoms. B/P  162/90 Is taking her blood pressure medications now. cbg 176. Stated these readings are average for her.Advised ED according to protocol. Patient is requesting a visit instead.Routing to PCP for contact with the patient for appointment.  Reason for Disposition . [1] Blurred vision or visual changes AND [2] present now AND [3] sudden onset or new (e.g., minutes, hours, days)  (Exception: seeing floaters / black specks OR previously diagnosed migraine headaches with same symptoms)  Answer Assessment - Initial Assessment Questions 1. DESCRIPTION: "What is the vision loss like? Describe it for me." (e.g., complete vision loss, blurred vision, double vision, floaters, etc.)     Blurred vision 2. LOCATION: "One or both eyes?" If one, ask: "Which eye?"     both 3. SEVERITY: "Can you see anything?" If so, ask: "What can you see?" (e.g., fine print)     Sees everything very blurry 4. ONSET: "When did this begin?" "Did it start suddenly or has this been gradual?"    suddenly5. PATTERN: "Does this come and go, or has it been constant since it started?"    constant 6. PAIN: "Is there any pain in your eye(s)?"  (Scale 1-10; or mild, moderate, severe)    no 7. CONTACTS-GLASSES: "Do you wear contacts or glasses?"     glasses 8. CAUSE: "What do you think is causing this visual problem?"    no 9. OTHER SYMPTOMS: "Do you have any other symptoms?" (e.g., confusion, headache, arm or leg weakness, speech problems)     no 10. PREGNANCY: "Is there any chance you are pregnant?" "When was your last menstrual period?"       na  Protocols used: [place]

## 2019-10-18 NOTE — Telephone Encounter (Signed)
Spoke w/ Pt- informed to see eye doctor. She will contact her new insurance provider to see who is in network in the area. She is aware of symptoms to watch out for- will go to ED if she develops any.

## 2019-10-18 NOTE — Telephone Encounter (Signed)
If she has a headache, nausea, chest pain, palpitations, double vision or a  stroke symptoms: ER If not, recommend to see her eye doctor ASAP.

## 2019-10-18 NOTE — Telephone Encounter (Signed)
Please advise 

## 2019-11-03 ENCOUNTER — Ambulatory Visit: Payer: Managed Care, Other (non HMO) | Admitting: Internal Medicine

## 2020-01-05 ENCOUNTER — Ambulatory Visit: Payer: Managed Care, Other (non HMO) | Admitting: Internal Medicine

## 2020-06-14 ENCOUNTER — Other Ambulatory Visit: Payer: Self-pay | Admitting: Internal Medicine
